# Patient Record
Sex: Female | Born: 1970 | Hispanic: Yes | Marital: Married | Smoking: Former smoker
Health system: Southern US, Community
[De-identification: ages and names within clinical notes are randomized; demographics above are authoritative.]

## PROBLEM LIST (undated history)

## (undated) DIAGNOSIS — N6320 Unspecified lump in the left breast, unspecified quadrant: Secondary | ICD-10-CM

## (undated) DIAGNOSIS — Q513 Bicornate uterus: Secondary | ICD-10-CM

## (undated) DIAGNOSIS — L709 Acne, unspecified: Secondary | ICD-10-CM

## (undated) DIAGNOSIS — K5909 Other constipation: Secondary | ICD-10-CM

## (undated) DIAGNOSIS — E039 Hypothyroidism, unspecified: Secondary | ICD-10-CM

## (undated) HISTORY — DX: Unspecified lump in the left breast, unspecified quadrant: N63.20

## (undated) HISTORY — PX: WISDOM TOOTH EXTRACTION: SHX21

## (undated) HISTORY — DX: Other constipation: K59.09

## (undated) HISTORY — DX: Acne, unspecified: L70.9

## (undated) HISTORY — DX: Hypothyroidism, unspecified: E03.9

## (undated) HISTORY — DX: Bicornate uterus: Q51.3

---

## 2000-01-02 HISTORY — PX: BREAST REDUCTION SURGERY: SHX8

## 2003-01-27 ENCOUNTER — Other Ambulatory Visit: Admission: RE | Admit: 2003-01-27 | Discharge: 2003-01-27 | Payer: Self-pay | Admitting: Family Medicine

## 2003-02-17 ENCOUNTER — Other Ambulatory Visit: Admission: RE | Admit: 2003-02-17 | Discharge: 2003-02-17 | Payer: Self-pay | Admitting: Family Medicine

## 2007-05-23 ENCOUNTER — Ambulatory Visit (HOSPITAL_COMMUNITY): Admission: RE | Admit: 2007-05-23 | Discharge: 2007-05-23 | Payer: Self-pay | Admitting: Family Medicine

## 2007-06-02 DIAGNOSIS — N6320 Unspecified lump in the left breast, unspecified quadrant: Secondary | ICD-10-CM

## 2007-06-02 HISTORY — DX: Unspecified lump in the left breast, unspecified quadrant: N63.20

## 2010-12-14 ENCOUNTER — Encounter: Payer: Self-pay | Admitting: Physician Assistant

## 2011-01-10 ENCOUNTER — Encounter: Payer: Self-pay | Admitting: Physician Assistant

## 2011-04-19 ENCOUNTER — Encounter: Payer: Self-pay | Admitting: Physician Assistant

## 2011-05-31 ENCOUNTER — Encounter: Payer: Self-pay | Admitting: Physician Assistant

## 2011-06-28 ENCOUNTER — Ambulatory Visit (INDEPENDENT_AMBULATORY_CARE_PROVIDER_SITE_OTHER): Payer: BC Managed Care – PPO | Admitting: Physician Assistant

## 2011-06-28 ENCOUNTER — Encounter: Payer: Self-pay | Admitting: Physician Assistant

## 2011-06-28 VITALS — BP 100/57 | HR 74 | Temp 97.8°F | Resp 16 | Ht 62.5 in | Wt 124.6 lb

## 2011-06-28 DIAGNOSIS — Z0000 Encounter for general adult medical examination without abnormal findings: Secondary | ICD-10-CM

## 2011-06-28 DIAGNOSIS — L708 Other acne: Secondary | ICD-10-CM

## 2011-06-28 DIAGNOSIS — L709 Acne, unspecified: Secondary | ICD-10-CM

## 2011-06-28 LAB — POCT URINALYSIS DIPSTICK
Bilirubin, UA: NEGATIVE
Glucose, UA: NEGATIVE
Nitrite, UA: NEGATIVE
Spec Grav, UA: 1.015
pH, UA: 7

## 2011-06-28 LAB — LIPID PANEL
Cholesterol: 173 mg/dL (ref 0–200)
HDL: 47 mg/dL (ref >39)
LDL Cholesterol: 111 mg/dL — ABNORMAL HIGH (ref 0–99)
Total CHOL/HDL Ratio: 3.7 Ratio
Triglycerides: 75 mg/dL (ref <150)
VLDL: 15 mg/dL (ref 0–40)

## 2011-06-28 LAB — POCT UA - MICROSCOPIC ONLY
Casts, Ur, LPF, POC: NEGATIVE
Mucus, UA: NEGATIVE
Yeast, UA: NEGATIVE

## 2011-06-28 LAB — COMPREHENSIVE METABOLIC PANEL
Alkaline Phosphatase: 73 U/L (ref 39–117)
BUN: 10 mg/dL (ref 6–23)
CO2: 25 mEq/L (ref 19–32)
Creat: 0.65 mg/dL (ref 0.50–1.10)
Glucose, Bld: 87 mg/dL (ref 70–99)
Total Bilirubin: 0.5 mg/dL (ref 0.3–1.2)
Total Protein: 6.8 g/dL (ref 6.0–8.3)

## 2011-06-28 LAB — CBC WITH DIFFERENTIAL/PLATELET
Basophils Relative: 0 % (ref 0–1)
Eosinophils Absolute: 0.2 10*3/uL (ref 0.0–0.7)
Eosinophils Relative: 3 % (ref 0–5)
Hemoglobin: 11.9 g/dL — ABNORMAL LOW (ref 12.0–15.0)
Lymphs Abs: 2 10*3/uL (ref 0.7–4.0)
MCH: 29 pg (ref 26.0–34.0)
MCHC: 33.4 g/dL (ref 30.0–36.0)
MCV: 86.6 fL (ref 78.0–100.0)
Monocytes Absolute: 0.4 10*3/uL (ref 0.1–1.0)
Monocytes Relative: 8 % (ref 3–12)
RBC: 4.11 MIL/uL (ref 3.87–5.11)

## 2011-06-28 NOTE — Patient Instructions (Addendum)
Keeping You Healthy  Get These Tests 1. Blood Pressure- Have your blood pressure checked once a year by your health care provider.  Normal blood pressure is 120/80. 2. Weight- Have your body mass index (BMI) calculated to screen for obesity.  BMI is measure of body fat based on height and weight.  You can also calculate your own BMI at https://www.west-esparza.com/. 3. Cholesterol- Have your cholesterol checked every 5 years starting at age 40 then yearly starting at age 79. 4. Chlamydia, HIV, and other sexually transmitted diseases- Get screened every year until age 16, then within three months of each new sexual provider. 5. Pap Smear- Every 1-3 years; discuss with your health care provider. 6. Mammogram- Every year starting at age 72  Take these medicines  Calcium with Vitamin D-Your body needs 1200 mg of Calcium each day and [phone] IU of Vitamin D daily.  Your body can only absorb 500 mg of Calcium at a time so Calcium must be taken in 2 or 3 divided doses throughout the day.  Multivitamin with folic acid- Once daily if it is possible for you to become pregnant.  Get these Immunizations  Gardasil-Series of three doses; prevents HPV related illness such as genital warts and cervical cancer.  Menactra-Single dose; prevents meningitis.  Tetanus shot- Every 10 years.  Flu shot-Every year.  Take these steps 1. Do not smoke-Your healthcare provider can help you quit.  For tips on how to quit go to www.smokefree.gov or call 1-800 QUITNOW. 2. Be physically active- Exercise 5 days a week for at least 30 minutes.  If you are not already physically active, start slow and gradually work up to 30 minutes of moderate physical activity.  Examples of moderate activity include walking briskly, dancing, swimming, bicycling, etc. 3. Breast Cancer- A self breast exam every month is important for early detection of breast cancer.  For more information and instruction on self breast exams, ask your  healthcare provider or [email or web address].[employer]. 4. Eat a healthy diet- Eat a variety of healthy foods such as fruits, vegetables, whole grains, low fat milk, low fat cheeses, yogurt, lean meats, poultry and fish, beans, nuts, tofu, etc.  For more information go to www. Thenutritionsource.org 5. Drink alcohol in moderation- Limit alcohol intake to one drink or less per day. Never drink and drive. 6. Depression- Your emotional health is as important as your physical health.  If you're feeling down or losing interest in things you normally enjoy please talk to your healthcare provider about being screened for depression. 7. Dental visit- Brush and floss your teeth twice daily; visit your dentist twice a year. 8. Eye doctor- Get an eye exam at least every 2 years. 9. Helmet use- Always wear a helmet when riding a bicycle, motorcycle, rollerblading or skateboarding. 10. Safe sex- If you may be exposed to sexually transmitted infections, use a condom. 11. Seat belts- Seat belts can save your live; always wear one. 12. Smoke/Carbon Monoxide detectors- These detectors need to be installed on the appropriate level of your home. Replace batteries at least once a year. 13. Skin cancer- When out in the sun please cover up and use sunscreen 15 SPF or higher. 14. Violence- If anyone is threatening or hurting you, please tell your healthcare provider.  To help with Dry Skin Drink at least 64 ounces of water daily. Consider a humidifier for the room where you sleep. Bathe once daily. Avoid using HOT water, as it dries skin. Avoid deodorant soaps (Dial is the  worst!) and stick with gentle cleansers (I like Cetaphil Liquid Cleanser). After bathing, dry off completely, then apply a thick emollient cream (I like Cetaphil Moisturizing Cream). Apply the cream twice daily, or more!  Try Jasmine December for lips [employer] sells it, as well as Air cabin crew at [employer])  [employer] at [employer].  They may have names of people to board or care for the lizard.

## 2011-06-28 NOTE — Progress Notes (Signed)
Subjective:    Patient ID: Melody Butler, female    DOB: Jul 04, 1970, 41 y.o.   MRN: [id number]  HPI  Presents for CPE, breast and pap test. History of left breast fibroadenoma. Mammogram in 12/08/2010, benign.  Review of Systems  Constitutional: Negative.   HENT: Negative.   Eyes: Negative.   Respiratory: Negative.   Cardiovascular: Negative.   Gastrointestinal: Negative.   Genitourinary: Negative.        [id number] Last pap 10/2009 was normal, HPV negative  Musculoskeletal: Positive for myalgias, back pain and arthralgias.       Joint and muscle pain since starting Accutane 3 months ago  Skin: Negative for color change, pallor, rash and wound.       Extremely dry skin since starting Accutane 3 months ago.  Neurological: Negative.   Hematological: Negative.   Psychiatric/Behavioral: Negative.        Objective:   Physical Exam  Vitals reviewed. Constitutional: She is oriented to person, place, and time. Vital signs are normal. She appears well-developed and well-nourished. No distress.  HENT:  Head: Normocephalic and atraumatic.  Right Ear: Hearing, tympanic membrane, external ear and ear canal normal. No foreign bodies.  Left Ear: Hearing, tympanic membrane, external ear and ear canal normal. No foreign bodies.  Nose: Nose normal.  Mouth/Throat: Uvula is midline, oropharynx is clear and moist and mucous membranes are normal. No oral lesions. Normal dentition. No dental abscesses or uvula swelling. No oropharyngeal exudate.  Eyes: Conjunctivae and EOM are normal. Pupils are equal, round, and reactive to light. Right eye exhibits no discharge. Left eye exhibits no discharge. No scleral icterus.  Fundoscopic exam:      The right eye shows no arteriolar narrowing, no AV nicking, no exudate, no hemorrhage and no papilledema. The right eye shows red reflex.The right eye shows no venous pulsations.      The left eye shows no arteriolar narrowing, no AV nicking, no exudate, no  hemorrhage and no papilledema. The left eye shows red reflex.The left eye shows no venous pulsations. Neck: Trachea normal, normal range of motion and full passive range of motion without pain. Neck supple. No spinous process tenderness and no muscular tenderness present. No mass and no thyromegaly present.  Cardiovascular: Normal rate, regular rhythm, normal heart sounds, intact distal pulses and normal pulses.   Pulmonary/Chest: Effort normal and breath sounds normal. She exhibits no tenderness and no retraction. Right breast exhibits no inverted nipple, no mass, no nipple discharge, no skin change and no tenderness. Left breast exhibits no inverted nipple, no mass, no nipple discharge, no skin change and no tenderness. Breasts are symmetrical.  Abdominal: Soft. Normal appearance and bowel sounds are normal. She exhibits no distension and no mass. There is no hepatosplenomegaly. There is no tenderness. There is no rigidity, no rebound, no guarding, no CVA tenderness, no tenderness at McBurney's point and negative Murphy's sign. No hernia.  Genitourinary: Rectum normal. No breast swelling, tenderness, discharge or bleeding.       Breasts are very dense in the upper outer region bilaterally. Scars from breast reduction surgery are noted.  Musculoskeletal: She exhibits no edema and no tenderness.       Cervical back: Normal.       Thoracic back: Normal.       Lumbar back: Normal.  Lymphadenopathy:       Head (right side): No tonsillar, no preauricular, no posterior auricular and no occipital adenopathy present.       Head (left side):  No tonsillar, no preauricular, no posterior auricular and no occipital adenopathy present.    She has no cervical adenopathy.    She has no axillary adenopathy.       Right: No supraclavicular adenopathy present.       Left: No supraclavicular adenopathy present.  Neurological: She is alert and oriented to person, place, and time. She has normal strength and normal  reflexes. No cranial nerve deficit. She exhibits normal muscle tone. Coordination and gait normal.  Skin: Skin is warm, dry and intact. No rash noted. She is not diaphoretic. No cyanosis or erythema. Nails show no clubbing.       Skin is extremely dry, especially the lips  Psychiatric: She has a normal mood and affect. Her speech is normal and behavior is normal. Judgment and thought content normal.      Assessment & Plan:   1. Routine general medical examination at a health care facility  POCT UA - Microscopic Only, POCT urinalysis dipstick, CBC with Differential, Comprehensive metabolic panel, Lipid panel, TSH; Repeat pap and HPV testing in 1-3 years. Repeat mammogram in 12/2011.  2. Acne  Continue follow-up with prescriber.  Re-inforced adequate hydration, good skin hygiene and suggested emollient lip product.

## 2011-06-30 ENCOUNTER — Encounter: Payer: Self-pay | Admitting: Physician Assistant

## 2011-08-03 ENCOUNTER — Ambulatory Visit: Payer: Self-pay | Admitting: Internal Medicine

## 2011-08-08 ENCOUNTER — Encounter (HOSPITAL_COMMUNITY): Payer: Self-pay | Admitting: Pharmacy Technician

## 2011-08-09 ENCOUNTER — Encounter (HOSPITAL_COMMUNITY): Payer: Self-pay

## 2011-08-09 ENCOUNTER — Encounter (HOSPITAL_COMMUNITY)
Admission: RE | Admit: 2011-08-09 | Discharge: 2011-08-09 | Disposition: A | Discharge status: Home / Self Care | Payer: BC Managed Care – PPO | Source: Ambulatory Visit | Attending: Specialist | Admitting: Specialist

## 2011-08-09 LAB — BASIC METABOLIC PANEL
BUN: 12 mg/dL (ref 6–23)
CO2: 25 mEq/L (ref 19–32)
Chloride: 104 mEq/L (ref 96–112)
GFR calc Af Amer: >90 mL/min (ref >90)
Potassium: 4.6 mEq/L (ref 3.5–5.1)

## 2011-08-09 LAB — CBC
HCT: 33.5 % — ABNORMAL LOW (ref 36.0–46.0)
Hemoglobin: 11.2 g/dL — ABNORMAL LOW (ref 12.0–15.0)
MCHC: 33.4 g/dL (ref 30.0–36.0)
MCV: 88.4 fL (ref 78.0–100.0)
WBC: 7.7 10*3/uL (ref 4.0–10.5)

## 2011-08-09 LAB — SURGICAL PCR SCREEN: Staphylococcus aureus: POSITIVE — AB

## 2011-08-09 MED ORDER — CHLORHEXIDINE GLUCONATE 4 % EX LIQD: 60.0000 mL | Freq: Once | CUTANEOUS | Status: DC

## 2011-08-09 MED ORDER — CEFAZOLIN SODIUM-DEXTROSE 2-3 GM-% IV SOLR
2.0000 g | INTRAVENOUS | Status: AC
  Administered 2011-08-10: 2 g via INTRAVENOUS
  Filled 2011-08-09: qty 50

## 2011-08-09 NOTE — Pre-Procedure Instructions (Signed)
20 Treva Huyett  08/09/2011   Your procedure is scheduled on:  Friday August 10, 2011 at 1230 PM  Report to [facility] at 1030 AM.  Call this number if you have problems the morning of surgery: [phone]   Remember:   Do not eat food or drink:After Midnight.      Take these medicines the morning of surgery with A SIP OF WATER: Hydrocodone [ NORCO/VICODIN]   Do not wear jewelry, make-up or nail polish.  Do not wear lotions, powders, or perfumes. You may wear deodorant.  Do not shave 48 hours prior to surgery.  Do not bring valuables to the hospital.  Contacts, dentures or bridgework may not be worn into surgery.  Leave suitcase in the car. After surgery it may be brought to your room.  For patients admitted to the hospital, checkout time is 11:00 AM the day of discharge.   Patients discharged the day of surgery will not be allowed to drive home.    Special Instructions: Incentive Spirometry - Practice and bring it with you on the day of surgery. and CHG Shower Use Special Wash: 1/2 bottle night before surgery and 1/2 bottle morning of surgery.   Please read over the following fact sheets that you were given: Pain Booklet, Coughing and Deep Breathing, MRSA Information and Surgical Site Infection Prevention

## 2011-08-10 ENCOUNTER — Encounter (HOSPITAL_COMMUNITY): Payer: Self-pay | Admitting: Specialist

## 2011-08-10 ENCOUNTER — Ambulatory Visit (HOSPITAL_COMMUNITY): Payer: BC Managed Care – PPO | Admitting: Anesthesiology

## 2011-08-10 ENCOUNTER — Observation Stay (HOSPITAL_COMMUNITY)
Admission: RE | Admit: 2011-08-10 | Discharge: 2011-08-11 | DRG: 219 | Disposition: A | Discharge status: Home / Self Care | Payer: BC Managed Care – PPO | Source: Ambulatory Visit | Attending: Specialist | Admitting: Specialist

## 2011-08-10 ENCOUNTER — Encounter (HOSPITAL_COMMUNITY)
Admission: RE | Disposition: A | Discharge status: Home / Self Care | Payer: Self-pay | Source: Ambulatory Visit | Attending: Specialist

## 2011-08-10 ENCOUNTER — Encounter (HOSPITAL_COMMUNITY): Payer: Self-pay | Admitting: Anesthesiology

## 2011-08-10 ENCOUNTER — Encounter (HOSPITAL_COMMUNITY): Payer: Self-pay | Admitting: *Deleted

## 2011-08-10 ENCOUNTER — Inpatient Hospital Stay (HOSPITAL_COMMUNITY): Payer: BC Managed Care – PPO

## 2011-08-10 DIAGNOSIS — X500XXA Overexertion from strenuous movement or load, initial encounter: Secondary | ICD-10-CM | POA: Insufficient documentation

## 2011-08-10 DIAGNOSIS — Z01812 Encounter for preprocedural laboratory examination: Secondary | ICD-10-CM | POA: Insufficient documentation

## 2011-08-10 DIAGNOSIS — S82843A Displaced bimalleolar fracture of unspecified lower leg, initial encounter for closed fracture: Principal | ICD-10-CM | POA: Insufficient documentation

## 2011-08-10 DIAGNOSIS — S82841A Displaced bimalleolar fracture of right lower leg, initial encounter for closed fracture: Secondary | ICD-10-CM | POA: Diagnosis present

## 2011-08-10 HISTORY — PX: ORIF ANKLE FRACTURE: SHX5408

## 2011-08-10 SURGERY — OPEN REDUCTION INTERNAL FIXATION (ORIF) ANKLE FRACTURE
Anesthesia: General | Site: Ankle | Laterality: Left | Wound class: Clean

## 2011-08-10 MED ORDER — MAGNESIUM CITRATE PO SOLN
1.0000 | Freq: Once | ORAL | Status: AC | PRN
  Filled 2011-08-10: qty 296

## 2011-08-10 MED ORDER — ENOXAPARIN SODIUM 30 MG/0.3ML ~~LOC~~ SOLN
30.0000 mg | Freq: Two times a day (BID) | SUBCUTANEOUS | Status: DC
  Administered 2011-08-11: 30 mg via SUBCUTANEOUS
  Filled 2011-08-10 (×4): qty 0.3

## 2011-08-10 MED ORDER — ISOTRETINOIN 40 MG PO CAPS: 40.0000 mg | ORAL_CAPSULE | Freq: Every day | ORAL | Status: DC

## 2011-08-10 MED ORDER — MIDAZOLAM HCL 2 MG/2ML IJ SOLN
1.0000 mg | INTRAMUSCULAR | Status: DC | PRN
  Administered 2011-08-10: 2 mg via INTRAVENOUS

## 2011-08-10 MED ORDER — METHOCARBAMOL 500 MG PO TABS
500.0000 mg | ORAL_TABLET | Freq: Four times a day (QID) | ORAL | Status: DC | PRN
  Administered 2011-08-10 – 2011-08-11 (×5): 500 mg via ORAL
  Filled 2011-08-10 (×4): qty 1

## 2011-08-10 MED ORDER — POLYETHYLENE GLYCOL 3350 17 G PO PACK: 17.0000 g | PACK | Freq: Every day | ORAL | Status: DC | PRN

## 2011-08-10 MED ORDER — METOCLOPRAMIDE HCL 5 MG/ML IJ SOLN: 5.0000 mg | Freq: Three times a day (TID) | INTRAMUSCULAR | Status: DC | PRN

## 2011-08-10 MED ORDER — MIDAZOLAM HCL 2 MG/2ML IJ SOLN
INTRAMUSCULAR | Status: AC
  Filled 2011-08-10: qty 2

## 2011-08-10 MED ORDER — MORPHINE SULFATE 2 MG/ML IJ SOLN
INTRAMUSCULAR | Status: AC
  Filled 2011-08-10: qty 1

## 2011-08-10 MED ORDER — MIDAZOLAM HCL 2 MG/2ML IJ SOLN: 0.5000 mg | Freq: Once | INTRAMUSCULAR | Status: DC | PRN

## 2011-08-10 MED ORDER — OXYCODONE HCL 5 MG PO TABS: 5.0000 mg | ORAL_TABLET | ORAL | Status: AC | PRN

## 2011-08-10 MED ORDER — ONDANSETRON HCL 4 MG/2ML IJ SOLN: 4.0000 mg | Freq: Four times a day (QID) | INTRAMUSCULAR | Status: DC | PRN

## 2011-08-10 MED ORDER — METHOCARBAMOL 500 MG PO TABS
ORAL_TABLET | ORAL | Status: AC
  Filled 2011-08-10: qty 1

## 2011-08-10 MED ORDER — DIPHENHYDRAMINE HCL 12.5 MG/5ML PO ELIX: 12.5000 mg | ORAL_SOLUTION | ORAL | Status: DC | PRN

## 2011-08-10 MED ORDER — ONDANSETRON HCL 4 MG/2ML IJ SOLN
INTRAMUSCULAR | Status: DC | PRN
  Administered 2011-08-10: 4 mg via INTRAVENOUS

## 2011-08-10 MED ORDER — OXYCODONE HCL 5 MG PO TABS
5.0000 mg | ORAL_TABLET | ORAL | Status: DC | PRN
  Administered 2011-08-10 (×2): 10 mg via ORAL
  Filled 2011-08-10: qty 2

## 2011-08-10 MED ORDER — OXYCODONE HCL 5 MG PO TABS
ORAL_TABLET | ORAL | Status: AC
  Filled 2011-08-10: qty 2

## 2011-08-10 MED ORDER — MEPERIDINE HCL 25 MG/ML IJ SOLN: 6.2500 mg | INTRAMUSCULAR | Status: DC | PRN

## 2011-08-10 MED ORDER — MUPIROCIN 2 % EX OINT
TOPICAL_OINTMENT | CUTANEOUS | Status: AC
  Filled 2011-08-10: qty 22

## 2011-08-10 MED ORDER — LACTATED RINGERS IV SOLN
INTRAVENOUS | Status: DC
  Administered 2011-08-10: 11:00:00 via INTRAVENOUS

## 2011-08-10 MED ORDER — 0.9 % SODIUM CHLORIDE (POUR BTL) OPTIME
TOPICAL | Status: DC | PRN
  Administered 2011-08-10: 1000 mL

## 2011-08-10 MED ORDER — FENTANYL CITRATE 0.05 MG/ML IJ SOLN
50.0000 ug | INTRAMUSCULAR | Status: DC | PRN
  Administered 2011-08-10: 100 ug via INTRAVENOUS

## 2011-08-10 MED ORDER — PROMETHAZINE HCL 25 MG/ML IJ SOLN: 6.2500 mg | INTRAMUSCULAR | Status: DC | PRN

## 2011-08-10 MED ORDER — BUPIVACAINE-EPINEPHRINE PF 0.5-1:200000 % IJ SOLN
INTRAMUSCULAR | Status: DC | PRN
  Administered 2011-08-10: 10 mL
  Administered 2011-08-10: 30 mL

## 2011-08-10 MED ORDER — DOCUSATE SODIUM 100 MG PO CAPS
100.0000 mg | ORAL_CAPSULE | Freq: Two times a day (BID) | ORAL | Status: DC
  Administered 2011-08-10 – 2011-08-11 (×2): 100 mg via ORAL
  Filled 2011-08-10 (×2): qty 1

## 2011-08-10 MED ORDER — CEFAZOLIN SODIUM 1-5 GM-% IV SOLN
1.0000 g | Freq: Four times a day (QID) | INTRAVENOUS | Status: AC
  Administered 2011-08-10 – 2011-08-11 (×3): 1 g via INTRAVENOUS
  Filled 2011-08-10 (×3): qty 50

## 2011-08-10 MED ORDER — MUPIROCIN 2 % EX OINT
TOPICAL_OINTMENT | Freq: Two times a day (BID) | CUTANEOUS | Status: DC
  Administered 2011-08-10: 22:00:00 via NASAL
  Administered 2011-08-10: 1 via NASAL
  Administered 2011-08-11: 10:00:00 via NASAL
  Filled 2011-08-10: qty 22

## 2011-08-10 MED ORDER — LACTATED RINGERS IV SOLN
INTRAVENOUS | Status: DC | PRN
  Administered 2011-08-10 (×2): via INTRAVENOUS

## 2011-08-10 MED ORDER — METOCLOPRAMIDE HCL 10 MG PO TABS: 5.0000 mg | ORAL_TABLET | Freq: Three times a day (TID) | ORAL | Status: DC | PRN

## 2011-08-10 MED ORDER — HYDROCODONE-ACETAMINOPHEN 5-325 MG PO TABS
1.0000 | ORAL_TABLET | ORAL | Status: DC | PRN
  Administered 2011-08-10 – 2011-08-11 (×5): 2 via ORAL
  Filled 2011-08-10 (×6): qty 2

## 2011-08-10 MED ORDER — METHOCARBAMOL 100 MG/ML IJ SOLN
500.0000 mg | Freq: Four times a day (QID) | INTRAMUSCULAR | Status: DC | PRN
  Filled 2011-08-10: qty 5

## 2011-08-10 MED ORDER — HYDROMORPHONE HCL PF 1 MG/ML IJ SOLN
INTRAMUSCULAR | Status: AC
  Filled 2011-08-10: qty 1

## 2011-08-10 MED ORDER — PROPOFOL 10 MG/ML IV EMUL
INTRAVENOUS | Status: DC | PRN
  Administered 2011-08-10: 200 mg via INTRAVENOUS

## 2011-08-10 MED ORDER — FENTANYL CITRATE 0.05 MG/ML IJ SOLN
INTRAMUSCULAR | Status: DC | PRN
  Administered 2011-08-10: 100 ug via INTRAVENOUS
  Administered 2011-08-10 (×2): 25 ug via INTRAVENOUS
  Administered 2011-08-10: 100 ug via INTRAVENOUS

## 2011-08-10 MED ORDER — HYDROMORPHONE HCL PF 1 MG/ML IJ SOLN
0.2500 mg | INTRAMUSCULAR | Status: DC | PRN
  Administered 2011-08-10 (×4): 0.5 mg via INTRAVENOUS

## 2011-08-10 MED ORDER — ONDANSETRON HCL 4 MG PO TABS: 4.0000 mg | ORAL_TABLET | Freq: Four times a day (QID) | ORAL | Status: DC | PRN

## 2011-08-10 MED ORDER — LIDOCAINE HCL (CARDIAC) 20 MG/ML IV SOLN
INTRAVENOUS | Status: DC | PRN
  Administered 2011-08-10: 50 mg via INTRAVENOUS

## 2011-08-10 MED ORDER — BISACODYL 5 MG PO TBEC
5.0000 mg | DELAYED_RELEASE_TABLET | Freq: Every day | ORAL | Status: DC | PRN
Start: 2011-08-10 — End: 2011-08-11

## 2011-08-10 MED ORDER — FENTANYL CITRATE 0.05 MG/ML IJ SOLN
INTRAMUSCULAR | Status: AC
  Filled 2011-08-10: qty 2

## 2011-08-10 MED ORDER — ASPIRIN 325 MG PO TABS
325.0000 mg | ORAL_TABLET | Freq: Two times a day (BID) | ORAL | Status: DC
  Administered 2011-08-10 – 2011-08-11 (×2): 325 mg via ORAL
  Filled 2011-08-10 (×4): qty 1

## 2011-08-10 MED ORDER — MORPHINE SULFATE 2 MG/ML IJ SOLN
1.0000 mg | INTRAMUSCULAR | Status: DC | PRN
  Administered 2011-08-10 – 2011-08-11 (×3): 1 mg via INTRAVENOUS
  Filled 2011-08-10: qty 1

## 2011-08-10 SURGICAL SUPPLY — 60 items
BANDAGE ELASTIC 6 VELCRO ST LF (GAUZE/BANDAGES/DRESSINGS) IMPLANT
BANDAGE ESMARK 6X9 LF (GAUZE/BANDAGES/DRESSINGS) ×1 IMPLANT
BANDAGE GAUZE ELAST BULKY 4 IN (GAUZE/BANDAGES/DRESSINGS) IMPLANT
BIT DRILL 2.5X110 QC LCP DISP (BIT) ×1 IMPLANT
BIT DRILL QC 3.5X110 (BIT) ×1 IMPLANT
BLADE SURG 10 STRL SS (BLADE) ×2 IMPLANT
BNDG CMPR 9X6 STRL LF SNTH (GAUZE/BANDAGES/DRESSINGS) ×1
BNDG ESMARK 6X9 LF (GAUZE/BANDAGES/DRESSINGS) ×2
CLOTH BEACON ORANGE TIMEOUT ST (SAFETY) ×2 IMPLANT
COVER SURGICAL LIGHT HANDLE (MISCELLANEOUS) ×2 IMPLANT
CUFF TOURNIQUET SINGLE 34IN LL (TOURNIQUET CUFF) ×1 IMPLANT
CUFF TOURNIQUET SINGLE 44IN (TOURNIQUET CUFF) IMPLANT
DRAPE OEC MINIVIEW 54X84 (DRAPES) ×1 IMPLANT
DRAPE U-SHAPE 47X51 STRL (DRAPES) ×2 IMPLANT
DRSG ADAPTIC 3X8 NADH LF (GAUZE/BANDAGES/DRESSINGS) IMPLANT
DRSG PAD ABDOMINAL 8X10 ST (GAUZE/BANDAGES/DRESSINGS) IMPLANT
DURAPREP 26ML APPLICATOR (WOUND CARE) ×1 IMPLANT
ELECT REM PT RETURN 9FT ADLT (ELECTROSURGICAL) ×2
ELECTRODE REM PT RTRN 9FT ADLT (ELECTROSURGICAL) ×1 IMPLANT
GLOVE BIOGEL PI IND STRL 7.5 (GLOVE) ×1 IMPLANT
GLOVE BIOGEL PI INDICATOR 7.5 (GLOVE) ×1
GLOVE ECLIPSE 7.0 STRL STRAW (GLOVE) ×2 IMPLANT
GLOVE ECLIPSE 8.5 STRL (GLOVE) ×2 IMPLANT
GLOVE SURG 8.5 LATEX PF (GLOVE) ×2 IMPLANT
GOWN PREVENTION PLUS LG XLONG (DISPOSABLE) IMPLANT
GOWN PREVENTION PLUS XXLARGE (GOWN DISPOSABLE) ×2 IMPLANT
GOWN STRL NON-REIN LRG LVL3 (GOWN DISPOSABLE) ×4 IMPLANT
KIT BASIN OR (CUSTOM PROCEDURE TRAY) ×2 IMPLANT
KIT ROOM TURNOVER OR (KITS) ×2 IMPLANT
MANIFOLD NEPTUNE II (INSTRUMENTS) ×2 IMPLANT
NEEDLE 22X1 1/2 (OR ONLY) (NEEDLE) ×2 IMPLANT
NS IRRIG 1000ML POUR BTL (IV SOLUTION) ×2 IMPLANT
PACK ORTHO EXTREMITY (CUSTOM PROCEDURE TRAY) ×2 IMPLANT
PAD ARMBOARD 7.5X6 YLW CONV (MISCELLANEOUS) ×4 IMPLANT
PAD CAST 4YDX4 CTTN HI CHSV (CAST SUPPLIES) IMPLANT
PADDING CAST COTTON 4X4 STRL (CAST SUPPLIES)
PLATE LCP 3.5 1/3 TUB 6HX69 (Plate) ×1 IMPLANT
SCREW CANC FT 12 4.0 (Screw) ×1 IMPLANT
SCREW CANC FT ST SFS 4X16 (Screw) ×1 IMPLANT
SCREW CORTEX 3.5 12MM (Screw) ×3 IMPLANT
SCREW CORTEX 3.5 18MM (Screw) ×1 IMPLANT
SCREW CORTEX 3.5 20MM (Screw) IMPLANT
SCREW CORTEX 3.5 26MM (Screw) ×1 IMPLANT
SCREW LOCK CORT ST 3.5X12 (Screw) IMPLANT
SCREW LOCK CORT ST 3.5X18 (Screw) IMPLANT
SCREW LOCK CORT ST 3.5X20 (Screw) IMPLANT
SCREW LOCK CORT ST 3.5X26 (Screw) IMPLANT
SPONGE GAUZE 4X4 12PLY (GAUZE/BANDAGES/DRESSINGS) IMPLANT
SPONGE LAP 4X18 X RAY DECT (DISPOSABLE) ×4 IMPLANT
SUCTION FRAZIER TIP 10 FR DISP (SUCTIONS) ×2 IMPLANT
SUT ETHILON 4 0 PS 2 18 (SUTURE) ×4 IMPLANT
SUT VIC AB 0 CT1 27 (SUTURE) ×4
SUT VIC AB 0 CT1 27XBRD ANBCTR (SUTURE) ×2 IMPLANT
SUT VIC AB 2-0 CT1 27 (SUTURE) ×4
SUT VIC AB 2-0 CT1 TAPERPNT 27 (SUTURE) ×2 IMPLANT
SYR CONTROL 10ML LL (SYRINGE) ×2 IMPLANT
TOWEL OR 17X24 6PK STRL BLUE (TOWEL DISPOSABLE) ×2 IMPLANT
TOWEL OR 17X26 10 PK STRL BLUE (TOWEL DISPOSABLE) ×2 IMPLANT
TUBE CONNECTING 12X1/4 (SUCTIONS) ×2 IMPLANT
WATER STERILE IRR 1000ML POUR (IV SOLUTION) ×2 IMPLANT

## 2011-08-10 NOTE — H&P (Signed)
Melody Butler is an 41 y.o. female.   Chief Complaint:Left ankle pain.+ HPI:41 year old female visiting relatives in [place] when she twisted her left ankle while wlking on [place] heel. Xrays with left ankle fracture 7/27. Returned to [facility] this past weekend and presented to Urgent Care Clinic Monday 08/06/2011 referred to [facility] seen on Tues with the left ankle showing a subluxed Weber B fracture of the left ankle. Recommended ORIF.  Past Medical History  Diagnosis Date  . Bicornate uterus   . Breast mass, left 06/2007  . Acne   . Constipation, chronic     Past Surgical History  Procedure Date  . Cesarean section     x2  . Breast reduction surgery 2002    Family History  Problem Relation Age of Onset  . Hypertension Mother   . Cancer Maternal Grandmother     breast CA  . Cancer Paternal Grandfather     prostate CA   Social History:  reports that she quit smoking about 5 months ago. She does not have any smokeless tobacco history on file. She reports that she drinks alcohol. She reports that she does not use illicit drugs.  Allergies: No Known Allergies  Medications Prior to Admission  Medication Sig Dispense Refill  . HYDROcodone-acetaminophen (NORCO/VICODIN) 5-325 MG per tablet Take 1-2 tablets by mouth every 4 (four) hours as needed. For pain      . ISOtretinoin (ACCUTANE) 40 MG capsule Take 40 mg by mouth daily.      . meloxicam (MOBIC) 15 MG tablet Take 15 mg by mouth daily as needed. For pain        Results for orders placed during the hospital encounter of 08/09/11 (from the past 48 hour(s))  HCG, SERUM, QUALITATIVE     Status: Normal   Collection Time   08/09/11  3:35 PM      Component Value Range Comment   Preg, Serum NEGATIVE  NEGATIVE   BASIC METABOLIC PANEL     Status: Normal   Collection Time   08/09/11  3:35 PM      Component Value Range Comment   Sodium 136  135 - 145 mEq/L    Potassium 4.6  3.5 - 5.1 mEq/L    Chloride 104  96 - 112 mEq/L    CO2 25  19 - 32 mEq/L    Glucose, Bld 96  70 - 99 mg/dL    BUN 12  6 - 23 mg/dL    Creatinine, Ser 1.61  0.50 - 1.10 mg/dL    Calcium 9.0  8.4 - 09.6 mg/dL    GFR calc non Af Amer >90  >90 mL/min    GFR calc Af Amer >90  >90 mL/min   CBC     Status: Abnormal   Collection Time   08/09/11  3:35 PM      Component Value Range Comment   WBC 7.7  4.0 - 10.5 K/uL    RBC 3.79 (*) 3.87 - 5.11 MIL/uL    Hemoglobin 11.2 (*) 12.0 - 15.0 g/dL    HCT 04.5 (*) 40.9 - 46.0 %    MCV 88.4  78.0 - 100.0 fL    MCH 29.6  26.0 - 34.0 pg    MCHC 33.4  30.0 - 36.0 g/dL    RDW 81.1  91.4 - 78.2 %    Platelets 308  150 - 400 K/uL   SURGICAL PCR SCREEN     Status: Abnormal   Collection  Time   08/09/11  3:39 PM      Component Value Range Comment   MRSA, PCR NEGATIVE  NEGATIVE    Staphylococcus aureus POSITIVE (*) NEGATIVE    No results found.  Review of Systems  Constitutional: Negative for fever, chills, weight loss, malaise/fatigue and diaphoresis.  HENT: Negative for hearing loss, ear pain, nosebleeds, congestion, sore throat, neck pain, tinnitus and ear discharge.   Eyes: Negative for blurred vision, double vision, photophobia, pain, discharge and redness.  Respiratory: Negative for cough, hemoptysis, sputum production, shortness of breath, wheezing and stridor.   Cardiovascular: Negative for chest pain, palpitations, orthopnea, claudication, leg swelling and PND.  Gastrointestinal: Negative for heartburn, nausea, vomiting, abdominal pain, diarrhea, constipation, blood in stool and melena.  Genitourinary: Negative for dysuria, urgency, frequency, hematuria and flank pain.  Musculoskeletal: Positive for back pain, joint pain and falls. Negative for myalgias.  Skin: Negative for itching and rash.  Neurological: Negative for dizziness, tingling, tremors, sensory change, speech change, focal weakness, seizures, loss of consciousness, weakness and headaches.  Endo/Heme/Allergies: Negative for environmental  allergies and polydipsia. Does not bruise/bleed easily.  Psychiatric/Behavioral: Negative for depression, suicidal ideas, hallucinations, memory loss and substance abuse. The patient is not nervous/anxious and does not have insomnia.     Last menstrual period 07/18/2011. Physical Exam  Constitutional: She is oriented to person, place, and time. She appears well-developed and well-nourished. No distress.  HENT:  Head: Normocephalic and atraumatic.  Right Ear: External ear normal.  Left Ear: External ear normal.  Nose: Nose normal.  Mouth/Throat: Oropharynx is clear and moist. No oropharyngeal exudate.  Eyes: Conjunctivae and EOM are normal. Pupils are equal, round, and reactive to light. Right eye exhibits no discharge. Left eye exhibits no discharge. No scleral icterus.  Neck: Normal range of motion. Neck supple. No JVD present. No tracheal deviation present. No thyromegaly present.  Cardiovascular: Normal rate, regular rhythm, normal heart sounds and intact distal pulses.  Exam reveals no gallop and no friction rub.   No murmur heard. Respiratory: Effort normal and breath sounds normal. No stridor. No respiratory distress. She has no wheezes. She has no rales. She exhibits no tenderness.  GI: Soft. Bowel sounds are normal. She exhibits no distension and no mass. There is no tenderness. There is no rebound and no guarding.  Musculoskeletal: Normal range of motion. She exhibits no edema and no tenderness.  Lymphadenopathy:    She has no cervical adenopathy.  Neurological: She is alert and oriented to person, place, and time. She has normal reflexes. She displays normal reflexes. No cranial nerve deficit. She exhibits abnormal muscle tone. Coordination normal.  Skin: Skin is warm and dry. No rash noted. She is not diaphoretic. No erythema. No pallor.  Psychiatric: She has a normal mood and affect. Her behavior is normal. Judgment and thought content normal.  Left leg with short leg slint, mild  sweling medial and lateral malleoli pulses are normal and sensory and motor are normal. Xray with lateral malleolus oblique spiral fracture with mild displacement, mortise is assymetric with lateral shift of talar dome the medial clear space measures 7 mm The superior joint line 3 mm.  Assessment/Plan Weber B left ankle fracture with lateral shif(subluxaation of the left ankle.  OR for ORIF of the left ankle  Plan ORIF of the lateral malleolus check for reduction of the mortise if the medial joint remains widened then exploration of the medial joint with deltoid repair.  NITKA,JAMES E 08/10/2011, 12:44 PM

## 2011-08-10 NOTE — Transfer of Care (Signed)
Immediate Anesthesia Transfer of Care Note  Patient: Melody Butler  Procedure(s) Performed: Procedure(s) (LRB): OPEN REDUCTION INTERNAL FIXATION (ORIF) ANKLE FRACTURE (Left)  Patient Location: PACU  Anesthesia Type: GA combined with regional for post-op pain  Level of Consciousness: awake and alert   Airway & Oxygen Therapy: Patient Spontanous Breathing and Patient connected to nasal cannula oxygen  Post-op Assessment: Report given to PACU RN and Post -op Vital signs reviewed and stable  Post vital signs: Reviewed and stable  Complications: No apparent anesthesia complications

## 2011-08-10 NOTE — H&P (Signed)
The patient has been re-examined, and the chart reviewed, and there have been no interval changes to the documented history and physical.    The risks, benefits, and alternatives have been discussed at length, and the patient is willing to proceed.   

## 2011-08-10 NOTE — Anesthesia Preprocedure Evaluation (Signed)
Anesthesia Evaluation  Patient identified by MRN, date of birth, ID band Patient awake    Reviewed: Allergy & Precautions, H&P , NPO status , Patient's Chart, lab work & pertinent test results  History of Anesthesia Complications Negative for: history of anesthetic complications  Airway Mallampati: I TM Distance: >3 FB Neck ROM: Full    Dental No notable dental hx. (+) Teeth Intact and Dental Advisory Given   Pulmonary former smoker (quit 2 years),  breath sounds clear to auscultation  Pulmonary exam normal       Cardiovascular negative cardio ROS  Rhythm:Regular Rate:Normal     Neuro/Psych negative neurological ROS     GI/Hepatic negative GI ROS, Neg liver ROS,   Endo/Other  negative endocrine ROS  Renal/GU negative Renal ROS     Musculoskeletal   Abdominal   Peds  Hematology   Anesthesia Other Findings   Reproductive/Obstetrics                           Anesthesia Physical Anesthesia Plan  ASA: II  Anesthesia Plan: General   Post-op Pain Management:    Induction: Intravenous  Airway Management Planned: LMA  Additional Equipment:   Intra-op Plan:   Post-operative Plan:   Informed Consent: I have reviewed the patients History and Physical, chart, labs and discussed the procedure including the risks, benefits and alternatives for the proposed anesthesia with the patient or authorized representative who has indicated his/her understanding and acceptance.   Dental advisory given  Plan Discussed with: CRNA and Surgeon  Anesthesia Plan Comments: (Plan routine monitors, GA- LMA OK, popliteal block for post op analgesia)        Anesthesia Quick Evaluation

## 2011-08-10 NOTE — Preoperative (Signed)
Beta Blockers   Reason not to administer Beta Blockers:Not Applicable 

## 2011-08-10 NOTE — Anesthesia Postprocedure Evaluation (Signed)
  Anesthesia Post-op Note  Patient: Melody Butler  Procedure(s) Performed: Procedure(s) (LRB): OPEN REDUCTION INTERNAL FIXATION (ORIF) ANKLE FRACTURE (Left)  Patient Location: PACU  Anesthesia Type: General  Level of Consciousness: awake, alert , oriented and patient cooperative  Airway and Oxygen Therapy: Patient Spontanous Breathing and Patient connected to nasal cannula oxygen  Post-op Pain: mild  Post-op Assessment: Post-op Vital signs reviewed, Patient's Cardiovascular Status Stable, Respiratory Function Stable, Patent Airway, No signs of Nausea or vomiting and Pain level controlled  Post-op Vital Signs: stable  Complications: No apparent anesthesia complications

## 2011-08-10 NOTE — Anesthesia Procedure Notes (Addendum)
Anesthesia Regional Block:  Popliteal block  Pre-Anesthetic Checklist: ,, timeout performed, Correct Patient, Correct Site, Correct Laterality, Correct Procedure, Correct Position, site marked, Risks and benefits discussed,  Surgical consent,  Pre-op evaluation,  At surgeon's request and post-op pain management  Laterality: Left  Prep: chloraprep       Needles:  Injection technique: Single-shot  Needle Type: Stimulator Needle - 80     Needle Length: 5cm 5 cm Needle Gauge: 22 and 22 G    Additional Needles:  Procedures: nerve stimulator Popliteal block  Nerve Stimulator or Paresthesia:  Response: toe abduction, 0.45 mA, 0.1 ms,   Additional Responses:   Narrative:  Start time: 08/10/2011 12:14 PM End time: 08/10/2011 12:21 PM Injection made incrementally with aspirations every 5 mL.  Performed by: Personally  Anesthesiologist: Sandford Craze, MD  Additional Notes: Pt identified in Holding room.  Monitors applied. Working IV access confirmed. Sterile prep L lateral knee.  #[id number] PNS to toe abduction twitch at 0.85mA threshold.  30cc 0.5% Bupivacaine with 1:200k epi injected incrementally after negative test dose. Reprep prox, ant-med tibia, 10cc 0.5% Bupivacaine 1:200k epi infiltrated subq for supplementation of saphenous nerve. Patient asymptomatic, VSS, no heme aspirated, tolerated well.   Sandford Craze, MD  Popliteal block Procedure Name: LMA Insertion Date/Time: 08/10/2011 1:19 PM Performed by: Gwenyth Allegra Pre-anesthesia Checklist: Patient identified, Timeout performed, Emergency Drugs available, Suction available and Patient being monitored Patient Re-evaluated:Patient Re-evaluated prior to inductionOxygen Delivery Method: Circle system utilized Preoxygenation: Pre-oxygenation with 100% oxygen Intubation Type: IV induction LMA: LMA inserted LMA Size: 4.0 Number of attempts: 1 Placement Confirmation: positive ETCO2 and breath sounds checked- equal and bilateral Tube secured  with: Tape Dental Injury: Teeth and Oropharynx as per pre-operative assessment

## 2011-08-10 NOTE — Brief Op Note (Signed)
08/10/2011  2:47 PM  PATIENT:  Melody Butler  41 y.o. female  PRE-OPERATIVE DIAGNOSIS:  Left ankle lateral malleolus fracture  POST-OPERATIVE DIAGNOSIS:  left ankle lateral malleoulus fracture Weber B. type ankle fracture long oblique spiral of the lateral malleolus with less than 15% posterior malleolar fracture and widening of the medial joint space.  PROCEDURE:  Procedure(s) (LRB): OPEN REDUCTION INTERNAL FIXATION (ORIF) ANKLE FRACTURE (Left) with a 3.5 mm one third semitubular 6-hole plate and a 3.5 lag screw the left lateral malleolus.  SURGEON:  Surgeon(s) and Role:    * Kerrin Champagne, MD - Primary  ANESTHESIA:   regional and general Dr. Jean Rosenthal  EBL:  Total I/O In: 1000 [I.V.:1000] Out: -   BLOOD ADMINISTERED:none  DRAINS: none   LOCAL MEDICATIONS USED:  NONE  SPECIMEN:  No Specimen  DISPOSITION OF SPECIMEN:  N/A  COUNTS:  YES  TOURNIQUET:   Total Tourniquet Time Documented: Thigh (Left) - 60 minutes  DICTATION: .Dragon Dictation  PLAN OF CARE: Admit to inpatient   PATIENT DISPOSITION: PACU Stable condition  Delay start of Pharmacological VTE agent (>24hrs) due to surgical blood loss or risk of bleeding: no

## 2011-08-10 NOTE — Op Note (Addendum)
08/10/2011  2:52 PM  PATIENT:  Melody Butler  41 y.o. female  MRN: [id number]  OPERATIVE REPORT  PRE-OPERATIVE DIAGNOSIS:  Left ankle lateral malleolus fracture Weber B. type ankle fracture long oblique spiral of the lateral malleolus with less than 15% posterior malleolar fracture and widening of the medial joint space   POST-OPERATIVE DIAGNOSIS:  left ankle lateral malleoulus fracture Weber B. type ankle fracture long oblique spiral of the lateral malleolus with less than 15% posterior malleolar fracture and widening of the medial joint space   PROCEDURE:  Procedure(s): OPEN REDUCTION INTERNAL FIXATION (ORIF) ANKLE FRACTUREwith a 3.5 mm one third semitubular 6-hole plate and a 3.5 lag screw the left lateral malleolus.     SURGEON:  Kerrin Champagne, MD     ANESTHESIA:  General, popliteal block by Dr. Jean Rosenthal    COMPLICATIONS:  None.     COMPONENTS: Synthes 3.5 mm one third semitubular 6-hole plate and a 3.5 lag screw the left lateral malleolus.  TOURNIQUET TIME: 60 min @ 250 mmHg  PROCEDURE:The patient was met in the holding area, and the appropriate leftt foot and short leg splint identified and marked with an "X" and my initials.The patient was then transported to OR and was placed on the operative table in a supine position. The patient was then placed under general anesthesia without difficulty. The patient received appropriate preoperative antibiotic prophylaxis Ancef.  Tourniquet was applied to the operative thigh. Leg was then prepped using sterile conditions and draped using sterile technique. A bump placed under the left buttock for internal rotation. Time-out procedure was called and correct  The was elevated and Esmarch exsanguinated with a thigh  tourniquet elevated ot 250 mmHg. incision was then made after timeout over the lateral aspect of the left fibula approximately 8 cm in length based at the expected fibular fracture site approximately 3 cm proximal to the plafond.  Incision was undermined proximally using a periosteal elevator in the subperiosteal plane also distally on the superficial portion of the fibula and posterior aspect. Fracture site identified and carefully opened with slight external rotation of the left leg and debridement of interposed muscle with dental pick. Fracture was then carefully aligned and held in place with reducing tenaculum. Longitudinal traction was necessary to reduce the fracture. mini C-arm was draped sterilely and brought into the field fracture observed to be reduced to anatomicly with the ankle mortise reduced to anatomic alignment.  Fracture held in place with lobster claw bone holding forceps a 3.5 drill hole placed through the anterior cortex of the proximal fragment directed obliquely and then a 2.5 drill bit sleeve placed through the 3.5 hole and the deep cortex drilled with a 2.5 drill. The depth of this hole was then measured and the appropriate size 26 mm length fully threaded cortical screw used to lag the fracture site.  A 6-hole 3.5  one third semitubular  plate was carefully contoured and aligned along the lateral posterior aspect of the crest of the fibula 3 holes on either side of the fracture line. Drill holes were then placed proximal to the fracture line springing of the fibula medially. These screw holes were made first drilling with a 2.5 drill entering depth and placing the appropriate screw.  The 2 distal screws were 4.0 fully threaded cancellus screw placed in neutral position. Mini C-arm was  used to confirm the distal tib-fib syndesmosis appeared reduced. The posterior malleolar fracture fragment reduced nicely and was less than 15% of the joint surface  so no fixation was performed. Distal tib-fib syndesmosis appeared reduced. Irrigation was then carried out. Lateral incision site was then irrigated with copious amounts of irrigant solution the deep subcutaneous layers were then approximated with interrupted 0 Vicryl  sutures more superficial layers with interrupted 2-0 Vicryl sutures and the skin closed with  Dermabond. Adaptic 4 x 4s ABDs pad then fixed to the incision sites with sterile Webril. And a well-padded short leg posterior splint applied with posterior strap and medial and lateral strap. Tourniquet was released total tourniquet time was 60 minutes at 250 mm Hg. All instrument and sponge counts were correct patient was then reactivated extubated returned to her bed and to the recovery room in satisfactory condition.       Dillon Livermore E  08/10/2011, 2:52 PM   And an and a and and and and and and

## 2011-08-11 MED ORDER — ONDANSETRON HCL 4 MG/2ML IJ SOLN: 4.0000 mg | Freq: Three times a day (TID) | INTRAMUSCULAR | Status: DC | PRN

## 2011-08-11 MED ORDER — ASPIRIN 325 MG PO TABS: 325.0000 mg | ORAL_TABLET | Freq: Two times a day (BID) | ORAL | Status: AC

## 2011-08-11 MED ORDER — HYDROCODONE-ACETAMINOPHEN 7.5-325 MG PO TABS: 2.0000 | ORAL_TABLET | ORAL | Status: AC | PRN

## 2011-08-11 NOTE — Progress Notes (Signed)
Per [facility] patient can only have either w/c or rolling walker and not both. Will get w/c for now and once she progresses then she can try for walker. Melody Butler 08/11/2011 420pm

## 2011-08-11 NOTE — Progress Notes (Signed)
Physical Therapy Note   08/11/11 1319  PT Visit Information  Last PT Received On 08/11/11  Assistance Needed +1  PT Time Calculation  PT Start Time 1219  PT Stop Time 1238  PT Time Calculation (min) 19 min  Subjective Data  Subjective I'm still waiting for the doctor  Precautions  Precautions Fall  Restrictions  Weight Bearing Restrictions Yes  RLE Weight Bearing NWB  Cognition  Overall Cognitive Status Appears within functional limits for tasks assessed/performed  Arousal/Alertness Awake/alert  Orientation Level Oriented X4 / Intact  Behavior During Session [facility] for tasks performed  Transfers  Transfers Sit to Stand;Stand to Sit  Sit to Stand 4: Min guard;With upper extremity assist;With armrests;From chair/3-in-1  Stand to Sit 4: Min guard;With upper extremity assist;With armrests;To chair/3-in-1  Details for Transfer Assistance Patient able to recall and demonstrate safe transfers with crutches.  Assist for balance.  Ambulation/Gait  Ambulation/Gait Assistance 4: Min guard  Ambulation Distance (Feet) 8 Feet  Assistive device Crutches  Ambulation/Gait Assistance Details Ambulated from chair to stairs in gym.  Instructing husband on safe way to assist patient.  Gait Pattern Step-to pattern;Decreased step length - left  Gait velocity decreased  Stairs Yes  Stairs Assistance 4: Min assist  Stairs Assistance Details (indicate cue type and reason) Instructed husband on safe guarding technique for stairs.  Patient able to recall sequencing for stairs with crutches.  Stair Management Technique Step to pattern;Forwards;With crutches  Number of Stairs 3   PT - End of Session  Equipment Utilized During Treatment Gait belt  Activity Tolerance Patient limited by fatigue;Patient limited by pain  Patient left in chair;with call bell/phone within reach;with family/visitor present  Nurse Communication Mobility status  PT - Assessment/Plan  Comments on Treatment Session Patient able to  demonstrate safe guarding of patient on stairs.  Patient able to recall correct stairs technique.  Ready for discharge from PT standpoint.  PT Plan Discharge plan remains appropriate;Frequency remains appropriate  PT Frequency Min 6X/week  Follow Up Recommendations No PT follow up;Supervision/Assistance - 24 hour  Equipment Recommended Rolling walker with 5" wheels;Wheelchair (measurements) (with elevating leg rests)  Acute Rehab PT Goals  PT Goal: Sit to Stand - Progress Progressing toward goal  PT Goal: Stand to Sit - Progress Progressing toward goal  PT Goal: Ambulate - Progress Progressing toward goal  PT Goal: Up/Down Stairs - Progress Met  PT General Charges  $$ ACUTE PT VISIT 1 Procedure  PT Treatments  $Gait Training 8-22 mins   Durenda Hurt. Renaldo Fiddler, [facility] Acute Rehab Services Pager [phone]

## 2011-08-11 NOTE — Progress Notes (Signed)
  CARE MANAGEMENT NOTE 08/11/2011  Patient:  DESTIN, KITTLER   Account Number:  [id number]  Date Initiated:  08/11/2011  Documentation initiated by:  Hoy Register.  Subjective/Objective Assessment:     Action/Plan:   Anticipated DC Date:  08/11/2011   Anticipated DC Plan:  HOME/SELF CARE      DC Planning Services  CM consult      Choice offered to / List presented to:     DME arranged  WHEELCHAIR - MANUAL  WALKER - ROLLING  BEDSIDE COMMODE      DME agency  [facility]        Status of service:  Completed, signed off Medicare Important Message given?   (If response is "NO", the following Medicare IM given date fields will be blank) Date Medicare IM given:   Date Additional Medicare IM given:    Discharge Disposition:  HOME/SELF CARE  Per UR Regulation:    If discussed at Long Length of Stay Meetings, dates discussed:    Comments:  08/11/11 345pm GHoward,RN,BC,BSN received orders for equipment and patient is ready for discharge. called [facility] not delivering today) and faxed orders. Driver will call if does not have equipment in warehouse.

## 2011-08-11 NOTE — Evaluation (Signed)
Physical Therapy Evaluation Patient Details Name: Melody Butler MRN: [id number] DOB: 1970-10-22 Today's Date: 08/11/2011 Time: 1021-1059 PT Time Calculation (min): 38 min  PT Assessment / Plan / Recommendation Clinical Impression  Patient limited by pain and fatigue today.  Will need to practice stairs with husband.  Recommend RW and w/c with elevating leg rests for discharge.  Will follow acutely for functional mobility/gait training and education.    PT Assessment  Patient needs continued PT services    Follow Up Recommendations  No PT follow up;Supervision/Assistance - 24 hour    Barriers to Discharge None      Equipment Recommendations  Rolling walker with 5" wheels;Wheelchair (measurements) (with elevating leg rests)    Recommendations for Other Services     Frequency Min 6X/week    Precautions / Restrictions Precautions Precautions: Fall Restrictions Weight Bearing Restrictions: Yes RLE Weight Bearing: Non weight bearing         Mobility  Bed Mobility Bed Mobility: Supine to Sit;Sitting - Scoot to Edge of Bed Supine to Sit: 4: Min assist;HOB flat Sitting - Scoot to Edge of Bed: 5: Supervision Details for Bed Mobility Assistance: Assist to move RLE off bed.  Cues for safety Transfers Transfers: Sit to Stand;Stand to Sit Sit to Stand: 4: Min assist;With upper extremity assist;From bed;From chair/3-in-1;With armrests Stand to Sit: 4: Min assist;With upper extremity assist;With armrests;To chair/3-in-1 Details for Transfer Assistance: Verbal cues for hand placement with use of RW and with use of crutches.  Assist for balance due to NWB status. Ambulation/Gait Ambulation/Gait Assistance: 4: Min assist Ambulation Distance (Feet): 54 Feet Assistive device: Rolling walker;Crutches Ambulation/Gait Assistance Details: Patient ambulated 15' with RW with min guard assist.  Patient then instructed in ambulation with crutches - 6' with min assist. More stable with RW. Gait  Pattern: Step-to pattern;Decreased step length - left (NWB RLE) Gait velocity: decreased Stairs: Yes Stairs Assistance: 4: Min assist Stairs Assistance Details (indicate cue type and reason): Instructed patient on technique for stairs with crutches.   Stair Management Technique: Step to pattern;Forwards;With crutches Number of Stairs: 3     Exercises     PT Diagnosis: Difficulty walking;Generalized weakness;Acute pain  PT Problem List: Decreased strength;Decreased range of motion;Decreased activity tolerance;Decreased balance;Decreased mobility;Decreased knowledge of use of DME;Decreased knowledge of precautions;Pain PT Treatment Interventions: DME instruction;Gait training;Stair training;Functional mobility training;Patient/family education   PT Goals Acute Rehab PT Goals PT Goal Formulation: With patient Time For Goal Achievement: 08/13/11 Potential to Achieve Goals: Good Pt will go Supine/Side to Sit: Independently;with HOB 0 degrees PT Goal: Supine/Side to Sit - Progress: Goal set today Pt will go Sit to Supine/Side: Independently;with HOB 0 degrees PT Goal: Sit to Supine/Side - Progress: Goal set today Pt will go Sit to Stand: with supervision;with upper extremity assist PT Goal: Sit to Stand - Progress: Goal set today Pt will go Stand to Sit: with supervision;with upper extremity assist PT Goal: Stand to Sit - Progress: Goal set today Pt will Ambulate: 51 - 150 feet;with supervision;with least restrictive assistive device PT Goal: Ambulate - Progress: Goal set today Pt will Go Up / Down Stairs: 3-5 stairs;with min assist;with least restrictive assistive device PT Goal: Up/Down Stairs - Progress: Goal set today  Visit Information  Last PT Received On: 08/11/11 Assistance Needed: +1    Subjective Data  Subjective: I'm supposed to go home today Patient Stated Goal: To go home   Prior Functioning  Home Living Lives With: Family Available Help at Discharge: Available 24  hours/day;Family (Patient's mother) Type of Home: House Home Access: Stairs to enter [employer] of Steps: 1 Entrance Stairs-Rails: None Home Layout: Two level Alternate Level Stairs-Number of Steps: flight Alternate Level Stairs-Rails: Left Bathroom Shower/Tub: Health visitor: Standard Home Adaptive Equipment: Crutches Prior Function Level of Independence: Independent Able to Take Stairs?: Yes Driving: Yes Vocation: Unemployed Communication Communication: No difficulties Dominant Hand: Right    Cognition  Overall Cognitive Status: Appears within functional limits for tasks assessed/performed Arousal/Alertness: Awake/alert Orientation Level: Oriented X4 / Intact Behavior During Session: WFL for tasks performed    Extremity/Trunk Assessment Right Upper Extremity Assessment RUE ROM/Strength/Tone: Within functional levels RUE Sensation: WFL - Light Touch Left Upper Extremity Assessment LUE ROM/Strength/Tone: Within functional levels LUE Sensation: WFL - Light Touch Right Lower Extremity Assessment RLE ROM/Strength/Tone: Unable to fully assess;Due to pain;Deficits RLE ROM/Strength/Tone Deficits: Moves LE against gravity.  Short leg cast in place Left Lower Extremity Assessment LLE ROM/Strength/Tone: Within functional levels LLE Sensation: WFL - Light Touch   Balance    End of Session PT - End of Session Equipment Utilized During Treatment: Gait belt Activity Tolerance: Patient limited by fatigue;Patient limited by pain Patient left: in chair;with call bell/phone within reach;with family/visitor present Nurse Communication: Mobility status (Discharge equipment)  GP     Vena Austria 08/11/2011, 1:18 PM Durenda Hurt. Renaldo Fiddler, [facility] Acute Rehab Services Pager [phone]

## 2011-08-13 ENCOUNTER — Encounter (HOSPITAL_COMMUNITY): Payer: Self-pay | Admitting: Specialist

## 2011-08-13 NOTE — Discharge Summary (Signed)
Physician Discharge Summary  Patient ID: Melody Butler MRN: [id number] DOB/AGE: [birth year] 41 y.o.  Admit date: 08/10/2011 Discharge date: 08/11/2011  Admission Diagnoses:  Principal Problem:  *Displaced bimalleolar fracture of right lower leg   Discharge Diagnoses:  Same  Past Medical History  Diagnosis Date  . Bicornate uterus   . Breast mass, left 06/2007  . Acne   . Constipation, chronic     Surgeries: Procedure(s): OPEN REDUCTION INTERNAL FIXATION (ORIF) ANKLE FRACTURE on 08/10/2011   Consultants:  None  Discharged Condition: Improved  Hospital Course: Melody Butler is an 41 y.o. female who was admitted 08/10/2011 with a chief complaint of No chief complaint on file. , and found to have a diagnosis of Displaced bimalleolar fracture of right lower leg.  She was brought to the operating room on 08/10/2011 and underwent the above named procedures.   She was given perioperative antibiotics:  Anti-infectives     Start     Dose/Rate Route Frequency Ordered Stop   08/10/11 1900   ceFAZolin (ANCEF) IVPB 1 g/50 mL premix        1 g 100 mL/hr over 30 Minutes Intravenous Every 6 hours 08/10/11 1650 08/11/11 0723   08/09/11 1421   ceFAZolin (ANCEF) IVPB 2 g/50 mL premix        2 g 100 mL/hr over 30 Minutes Intravenous 60 min pre-op 08/09/11 1421 08/10/11 1300        .  She was given sequential compression devices, early ambulation, and chemoprophylaxis for DVT prophylaxis. Underwent physical therapy on post op day #1 a rolling and Wheelchair were ordered. [employer] working with the patient Would only approve one or the other so a W/C was ordered And a rolling walker. Physical therapy discharged the patient  As appropriate for going home. She was then discharged home on 08/11/2011.   She benefited maximally from their hospital stay and there were no complications.    Recent vital signs:  Filed Vitals:   08/11/11 1300  BP: 96/50  Pulse: 95  Temp: 98.9 F (37.2 C)  Resp: 20      Recent laboratory studies:  Results for orders placed during the hospital encounter of 08/09/11  SURGICAL PCR SCREEN      Component Value Range   MRSA, PCR NEGATIVE  NEGATIVE   Staphylococcus aureus POSITIVE (*) NEGATIVE  HCG, SERUM, QUALITATIVE      Component Value Range   Preg, Serum NEGATIVE  NEGATIVE  BASIC METABOLIC PANEL      Component Value Range   Sodium 136  135 - 145 mEq/L   Potassium 4.6  3.5 - 5.1 mEq/L   Chloride 104  96 - 112 mEq/L   CO2 25  19 - 32 mEq/L   Glucose, Bld 96  70 - 99 mg/dL   BUN 12  6 - 23 mg/dL   Creatinine, Ser 8.65  0.50 - 1.10 mg/dL   Calcium 9.0  8.4 - 78.4 mg/dL   GFR calc non Af Amer >90  >90 mL/min   GFR calc Af Amer >90  >90 mL/min  CBC      Component Value Range   WBC 7.7  4.0 - 10.5 K/uL   RBC 3.79 (*) 3.87 - 5.11 MIL/uL   Hemoglobin 11.2 (*) 12.0 - 15.0 g/dL   HCT 69.6 (*) 29.5 - 28.4 %   MCV 88.4  78.0 - 100.0 fL   MCH 29.6  26.0 - 34.0 pg   MCHC 33.4  30.0 - 36.0 g/dL  RDW 13.7  11.5 - 15.5 %   Platelets 308  150 - 400 K/uL    Discharge Medications:   Medication List  As of 08/13/2011 10:22 AM   STOP taking these medications         HYDROcodone-acetaminophen 5-325 MG per tablet         TAKE these medications         aspirin 325 MG tablet   Take 1 tablet (325 mg total) by mouth 2 (two) times daily after a meal.      HYDROcodone-acetaminophen 7.5-325 MG per tablet   Commonly known as: NORCO   Take 2 tablets by mouth every 4 (four) hours as needed for pain.      ISOtretinoin 40 MG capsule   Commonly known as: ACCUTANE   Take 40 mg by mouth daily.      meloxicam 15 MG tablet   Commonly known as: MOBIC   Take 15 mg by mouth daily as needed. For pain      oxyCODONE 5 MG immediate release tablet   Commonly known as: Oxy IR/ROXICODONE   Take 1-2 tablets (5-10 mg total) by mouth every 4 (four) hours as needed.            Diagnostic Studies: Dg Ankle Left Port  08/10/2011  *RADIOLOGY REPORT*  Clinical Data:  ORIF left ankle.  PORTABLE LEFT ANKLE - 2 VIEW  Comparison: None.  Findings: Postoperative changes with internal fixation of the distal fibular fracture.  Near anatomic alignment.  No hardware complicating feature.  IMPRESSION: Internal fixation of the distal fibular fracture.  Original Report Authenticated By: Cyndie Chime, M.D.    Disposition: 01-Home or Self Care  Discharge Orders    Future Orders Please Complete By Expires   Diet - low sodium heart healthy      Call MD / Call 911      Comments:   If you experience chest pain or shortness of breath, CALL 911 and be transported to the hospital emergency room.  If you develope a fever above 101 F, pus (white drainage) or increased drainage or redness at the wound, or calf pain, call your surgeon's office.   Constipation Prevention      Comments:   Drink plenty of fluids.  Prune juice may be helpful.  You may use a stool softener, such as Colace (over the counter) 100 mg twice a day.  Use MiraLax (over the counter) for constipation as needed.   Increase activity slowly as tolerated      Discharge instructions      Comments:   Keep short leg splint and dressing dry. May use water impervious bag or cast bag and tub chair to shower Tape the top of bag to skin to avoid moisture soaking the dressing on the leg. Call if there is odor or saturation of dressing or worsening pain not controlled with medications. Call if fever greater than 101.5. Use crutches or walker no weight bearing on the ankle fracture leg. Please follow up with an appointment with Dr. Otelia Sergeant  2 weeks from the time of surgery. Elevate as often as possible during the first week after surgery gradually increasing the time the leg is dependent or down there after. If swelling recurrs then elevate again. Wheel chair for longer distances. Take Aspirin 325 mg twice a day with meal or snack.   Driving restrictions      Comments:   No driving for 3 weeks   Lifting restrictions  Comments:   No lifting for 6 weeks         Signed: Jamy Whyte E 08/13/2011, 10:22 AM

## 2011-11-15 ENCOUNTER — Ambulatory Visit
Admission: RE | Admit: 2011-11-15 | Discharge: 2011-11-15 | Disposition: A | Discharge status: Home / Self Care | Payer: BC Managed Care – PPO | Source: Ambulatory Visit | Attending: Specialist | Admitting: Specialist

## 2011-11-15 ENCOUNTER — Other Ambulatory Visit: Payer: Self-pay | Admitting: Specialist

## 2011-11-15 DIAGNOSIS — R52 Pain, unspecified: Secondary | ICD-10-CM

## 2012-04-10 ENCOUNTER — Telehealth: Payer: Self-pay

## 2012-04-10 NOTE — Telephone Encounter (Signed)
Spoke to patient and told her we do have documentation in her paper chart that she was seen at [facility] in 2009. She said that was all she needed to know.

## 2012-04-10 NOTE — Telephone Encounter (Signed)
PT HAD DONE A CREDIT REPORT AND FOUND AN OUTSTANDING BILL ON HER REPORT FOR $85 FROM [facility].  THEY TOLD HER SHE HAD IT DONE AT [facility] AND THAT WE WERE THE ONES WHO SENT HER THERE.  THIS HAPPENED IN 2009.  SHE SAYS SHE NEVER WENT FOR THIS AND WANTS [place] TO CHECK HER RECORD FOR A REFERRAL.  SHE BELIEVES SOMEONE BILLED HER INCORRECTLY.  PLEASE CALL HER BACK AT [phone]. [facility] # IS [id number]

## 2012-05-06 ENCOUNTER — Encounter: Payer: Self-pay | Admitting: Family Medicine

## 2012-07-03 ENCOUNTER — Encounter: Payer: BC Managed Care – PPO | Admitting: Physician Assistant

## 2012-10-07 ENCOUNTER — Encounter: Payer: Self-pay | Admitting: Physician Assistant

## 2012-10-07 ENCOUNTER — Ambulatory Visit (INDEPENDENT_AMBULATORY_CARE_PROVIDER_SITE_OTHER): Payer: BC Managed Care – PPO | Admitting: Physician Assistant

## 2012-10-07 VITALS — BP 118/60 | HR 68 | Temp 98.2°F | Resp 16 | Ht 62.5 in | Wt 135.8 lb

## 2012-10-07 DIAGNOSIS — N920 Excessive and frequent menstruation with regular cycle: Secondary | ICD-10-CM

## 2012-10-07 DIAGNOSIS — Q513 Bicornate uterus: Secondary | ICD-10-CM | POA: Insufficient documentation

## 2012-10-07 DIAGNOSIS — N6320 Unspecified lump in the left breast, unspecified quadrant: Secondary | ICD-10-CM

## 2012-10-07 DIAGNOSIS — Z23 Encounter for immunization: Secondary | ICD-10-CM

## 2012-10-07 DIAGNOSIS — N63 Unspecified lump in unspecified breast: Secondary | ICD-10-CM

## 2012-10-07 DIAGNOSIS — Z0000 Encounter for general adult medical examination without abnormal findings: Secondary | ICD-10-CM

## 2012-10-07 LAB — POCT UA - MICROSCOPIC ONLY
Bacteria, U Microscopic: NEGATIVE
Crystals, Ur, HPF, POC: NEGATIVE
WBC, Ur, HPF, POC: NEGATIVE

## 2012-10-07 LAB — POCT URINALYSIS DIPSTICK
Bilirubin, UA: NEGATIVE
Ketones, UA: NEGATIVE
Leukocytes, UA: NEGATIVE
Protein, UA: NEGATIVE
Spec Grav, UA: <=1.005
pH, UA: 6.5

## 2012-10-07 LAB — CBC WITH DIFFERENTIAL/PLATELET
Eosinophils Absolute: 0.1 10*3/uL (ref 0.0–0.7)
Hemoglobin: 12.7 g/dL (ref 12.0–15.0)
Lymphocytes Relative: 31 % (ref 12–46)
Lymphs Abs: 1.8 10*3/uL (ref 0.7–4.0)
MCH: 29.1 pg (ref 26.0–34.0)
Monocytes Relative: 10 % (ref 3–12)
Neutro Abs: 3.2 10*3/uL (ref 1.7–7.7)
Neutrophils Relative %: 58 % (ref 43–77)
RBC: 4.36 MIL/uL (ref 3.87–5.11)
WBC: 5.6 10*3/uL (ref 4.0–10.5)

## 2012-10-07 NOTE — Progress Notes (Signed)
Subjective:    Patient ID: Melody Butler, female    DOB: 18-Mar-1970, 42 y.o.   MRN: [id number]  HPI Melody Butler is a 42 year old female here today for her physical. She complains of heavy menstrual bleeding that last 8 days and seems to be coming early. Her periods are about every 22 days. She reports being extremely fatigued, emotional, and achey during her period. She has noted blood clots in the pads. She has to change her pad frequently during the start of her period. She has not seen GYN since being pregnant. She has tried numerous OCP which she did not tolerate well- increased headaches and acne, increased weight, mood swings.   She still suffers from left ankle pain and leg pain on occasion after her ankle fracture. She is being followed by orthopedics who may repeat her surgery soon.  Melody Butler continues to smoke 1-2 cigarettes a day and drinks alcohol socially. She is married and has no concerns for STI. She does not take any chronic medications.  Pap and HPV: 06/2011, normal Mammogram: 03/2012, stable, repeat in one year, Flu: today  Review of Systems  Constitutional: Negative for fever, chills, activity change, appetite change, fatigue and unexpected weight change.  HENT: Negative for hearing loss, congestion, sore throat, facial swelling, rhinorrhea, trouble swallowing, neck pain, dental problem, postnasal drip and ear discharge.   Eyes: Negative for photophobia, itching and visual disturbance. Eye discharge: increase in watery eyes.  Respiratory: Negative for cough, chest tightness and shortness of breath.   Cardiovascular: Negative for chest pain and palpitations.  Gastrointestinal: Positive for diarrhea and constipation. Negative for nausea and vomiting.  Endocrine: Negative for cold intolerance, heat intolerance, polydipsia, polyphagia and polyuria.  Genitourinary: Positive for vaginal discharge (unchanged for years) and menstrual problem. Negative for dysuria, urgency,  frequency, hematuria, vaginal bleeding, difficulty urinating and pelvic pain.  Allergic/Immunologic: Negative for environmental allergies and food allergies.  Neurological: Negative for dizziness and headaches.  Hematological: Does not bruise/bleed easily.  Psychiatric/Behavioral: Negative.        Objective:   Physical Exam  Constitutional: She is oriented to person, place, and time. She appears well-developed and well-nourished.  HENT:  Head: Normocephalic and atraumatic.  Right Ear: Hearing, tympanic membrane, external ear and ear canal normal.  Left Ear: Hearing, tympanic membrane, external ear and ear canal normal.  Nose: Nose normal.  Mouth/Throat: Uvula is midline, oropharynx is clear and moist and mucous membranes are normal.  Eyes: Conjunctivae, EOM and lids are normal. Pupils are equal, round, and reactive to light. Right eye exhibits no discharge and no exudate. Left eye exhibits no discharge and no exudate. No scleral icterus.  Fundoscopic exam:      The right eye shows no arteriolar narrowing, no AV nicking, no exudate, no hemorrhage and no papilledema. The right eye shows red reflex.       The left eye shows no arteriolar narrowing, no AV nicking, no exudate, no hemorrhage and no papilledema. The left eye shows red reflex.  Neck: Trachea normal, normal range of motion and full passive range of motion without pain. No mass present.  Cardiovascular: Normal rate, regular rhythm, normal heart sounds and normal pulses.   Pulmonary/Chest: Effort normal and breath sounds normal. She exhibits tenderness (right axilla and left lateral breast, stable for patient). She exhibits no mass, no bony tenderness and no deformity. Right breast exhibits tenderness. Right breast exhibits no inverted nipple, no mass, no nipple discharge and no skin change. Left  breast exhibits mass (2 cm cystic lesion, mobile on left lateral breast. painful to palpation during menses, at patient's base line) and  tenderness. Left breast exhibits no inverted nipple, no nipple discharge and no skin change. Breasts are symmetrical.  B/l scars on breasts from breast reduction surgery, no areas of errythema or infection  Abdominal: Normal appearance and bowel sounds are normal. There is no hepatosplenomegaly. There is no tenderness.  Genitourinary: There is breast tenderness. No breast swelling, discharge or bleeding.  Musculoskeletal: Normal range of motion.  Lymphadenopathy:    She has no cervical adenopathy.  Neurological: She is alert and oriented to person, place, and time. She has normal strength and normal reflexes. No cranial nerve deficit or sensory deficit. Coordination normal.  Skin: Skin is warm and dry.  Psychiatric: She has a normal mood and affect. Her speech is normal and behavior is normal.     BP 118/60  Pulse 68  Temp(Src) 98.2 F (36.8 C) (Oral)  Resp 16  Ht 5' 2.5" (1.588 m)  Wt 135 lb 12.8 oz (61.598 kg)  BMI 24.43 kg/m2  SpO2 100%     Assessment & Plan:  Annual physical exam - Plan: CBC with Differential, Comprehensive metabolic panel, Lipid panel, TSH, POCT UA - Microscopic Only, POCT urinalysis dipstick  Need for influenza vaccination - Plan: Flu Vaccine QUAD 36+ mos IM  Menorrhagia with regular cycle - Plan: Ambulatory referral to Gynecology

## 2012-10-07 NOTE — Progress Notes (Signed)
  Subjective:    Patient ID: Melody Butler, female    DOB: 1970/12/12, 42 y.o.   MRN: [id number]  HPI    Review of Systems  Constitutional: Negative.   HENT: Positive for neck pain.   Eyes: Negative.   Respiratory: Negative.   Cardiovascular: Negative.   Gastrointestinal: Positive for constipation.  Genitourinary: Positive for vaginal discharge.  Skin: Negative.   Allergic/Immunologic: Negative.   Neurological: Negative.   Hematological: Negative.   Psychiatric/Behavioral: Negative.        Objective:   Physical Exam        Assessment & Plan:

## 2012-10-07 NOTE — Patient Instructions (Addendum)
I will contact you with your lab results as soon as they are available.   If you have not heard from me in 2 weeks, please contact me.  The fastest way to get your results is to register for My Chart (see the instructions on the last page of this printout).  If you have not heard anything regarding the referral in 1-2 weeks, please contact our office.  Keeping You Healthy  Get These Tests 1. Blood Pressure- Have your blood pressure checked once a year by your health care provider.  Normal blood pressure is 120/80. 2. Weight- Have your body mass index (BMI) calculated to screen for obesity.  BMI is measure of body fat based on height and weight.  You can also calculate your own BMI at [email or web address]://www.west-esparza.com/. 3. Cholesterol- Have your cholesterol checked every 5 years starting at age 25 then yearly starting at age 6. 4. Chlamydia, HIV, and other sexually transmitted diseases- Get screened every year until age 74, then within three months of each new sexual provider. 5. Pap Smear- Every 1-3 years; discuss with your health care provider. 6. Mammogram- Every year starting at age 66  Take these medicines  Calcium with Vitamin D-Your body needs 1200 mg of Calcium each day and [phone] IU of Vitamin D daily.  Your body can only absorb 500 mg of Calcium at a time so Calcium must be taken in 2 or 3 divided doses throughout the day.  Multivitamin with folic acid- Once daily if it is possible for you to become pregnant.  Get these Immunizations  Gardasil-Series of three doses; prevents HPV related illness such as genital warts and cervical cancer.  Menactra-Single dose; prevents meningitis.  Tetanus shot- Every 10 years.  Flu shot-Every year.  Take these steps 1. Do not smoke-Your healthcare provider can help you quit.  For tips on how to quit go to www.smokefree.gov or call 1-800 QUITNOW. 2. Be physically active- Exercise 5 days a week for at least 30 minutes.  If you are not already  physically active, start slow and gradually work up to 30 minutes of moderate physical activity.  Examples of moderate activity include walking briskly, dancing, swimming, bicycling, etc. 3. Breast Cancer- A self breast exam every month is important for early detection of breast cancer.  For more information and instruction on self breast exams, ask your healthcare provider or [email or web address]. 4. Eat a healthy diet- Eat a variety of healthy foods such as fruits, vegetables, whole grains, low fat milk, low fat cheeses, yogurt, lean meats, poultry and fish, beans, nuts, tofu, etc.  For more information go to www. Thenutritionsource.org 5. Drink alcohol in moderation- Limit alcohol intake to one drink or less per day. Never drink and drive. 6. Depression- Your emotional health is as important as your physical health.  If you're feeling down or losing interest in things you normally enjoy please talk to your healthcare provider about being screened for depression. 7. Dental visit- Brush and floss your teeth twice daily; visit your dentist twice a year. 8. Eye doctor- Get an eye exam at least every 2 years. 9. Helmet use- Always wear a helmet when riding a bicycle, motorcycle, rollerblading or skateboarding. 10. Safe sex- If you may be exposed to sexually transmitted infections, use a condom. 11. Seat belts- Seat belts can save your live; always wear one. 12. Smoke/Carbon Monoxide detectors- These detectors need to be installed on the appropriate level of your home. Replace batteries at least once a year. 13.  Skin cancer- When out in the sun please cover up and use sunscreen 15 SPF or higher. 14. Violence- If anyone is threatening or hurting you, please tell your healthcare provider.

## 2012-10-08 ENCOUNTER — Encounter: Payer: Self-pay | Admitting: Physician Assistant

## 2012-10-08 LAB — COMPREHENSIVE METABOLIC PANEL
ALT: 9 U/L (ref 0–35)
Albumin: 4.4 g/dL (ref 3.5–5.2)
CO2: 26 mEq/L (ref 19–32)
Chloride: 107 mEq/L (ref 96–112)
Glucose, Bld: 86 mg/dL (ref 70–99)
Potassium: 4.2 mEq/L (ref 3.5–5.3)
Sodium: 139 mEq/L (ref 135–145)
Total Protein: 6.9 g/dL (ref 6.0–8.3)

## 2012-10-08 LAB — LIPID PANEL
Cholesterol: 188 mg/dL (ref 0–200)
Triglycerides: 100 mg/dL (ref <150)

## 2012-10-08 NOTE — Progress Notes (Signed)
I have examined this patient along with the student and agree.  

## 2012-10-16 ENCOUNTER — Telehealth: Payer: Self-pay | Admitting: Gynecology

## 2012-10-16 NOTE — Telephone Encounter (Signed)
Patient said she just received a call from our office. I did not see a phone note and informed her it may have been a reminder call for her appointment next week.

## 2012-10-21 ENCOUNTER — Ambulatory Visit (INDEPENDENT_AMBULATORY_CARE_PROVIDER_SITE_OTHER): Payer: BC Managed Care – PPO | Admitting: Gynecology

## 2012-10-21 ENCOUNTER — Encounter: Payer: Self-pay | Admitting: Gynecology

## 2012-10-21 VITALS — BP 106/66 | HR 80 | Resp 18 | Ht 62.5 in | Wt 134.0 lb

## 2012-10-21 DIAGNOSIS — Q51810 Arcuate uterus: Secondary | ICD-10-CM

## 2012-10-21 DIAGNOSIS — Z124 Encounter for screening for malignant neoplasm of cervix: Secondary | ICD-10-CM

## 2012-10-21 DIAGNOSIS — Z01419 Encounter for gynecological examination (general) (routine) without abnormal findings: Secondary | ICD-10-CM

## 2012-10-21 DIAGNOSIS — N946 Dysmenorrhea, unspecified: Secondary | ICD-10-CM

## 2012-10-21 DIAGNOSIS — N920 Excessive and frequent menstruation with regular cycle: Secondary | ICD-10-CM

## 2012-10-21 NOTE — Progress Notes (Signed)
42 y.o. Married Hispanic female   G2P2002 here for annual exam. Pt is currently sexually active.  Pt has history of chronic constipation and has had success with miralax, just started chia and has had good results.  Pt reports that cycles are 25d for 8d, very heavy.  Using pads changes every 2-3h but will wear 3 pads simultaneous, for 4d, clots, and increase in pain mostly to back can cause pt to lie in bed for 3d,  Motrin 600mg  every day.  Doesn't like to take medications.  Denies dyspareunia.  History of headache-dull on ocp but not migraine.  Pt recently had labs at PCP normal TSH,CBC.  U/S 2009 no fibroids  Patient's last menstrual period was 10/10/2012.          Sexually active: yes  The current method of family planning is partner has a Vasectomy.    Exercising: yes  walking mon-thurs 4/wk Last pap:  06/2011- Negative Alcohol: once in awhile Tobacco:5 cigs/wk BSE: sometimes  Hgb: PCP ; Urine: PCP    Health Maintenance  Topic Date Due  . Influenza Vaccine  08/01/2013  . Pap Smear  01/01/2014  . Tetanus/tdap  12/18/2017    Family History  Problem Relation Age of Onset  . Hypertension Mother   . Cancer Maternal Grandmother     breast CA  . Cancer Paternal Grandfather     prostate CA  . Hypertension Maternal Grandfather   . Cancer Paternal Grandmother 85    Patient Active Problem List   Diagnosis Date Noted  . 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 Breast mass, left 10/07/2012  . Displaced bimalleolar fracture of right lower leg 08/10/2011    Class: Acute    Past Medical History  Diagnosis Date  . Bicornate uterus   . Breast mass, left 06/2007  . Acne   . Constipation, chronic     Past Surgical History  Procedure Laterality Date  . Breast reduction surgery  2002  . Orif ankle fracture  08/10/2011    Procedure: OPEN REDUCTION INTERNAL FIXATION (ORIF) ANKLE FRACTURE;  Surgeon: Kerrin Champagne, MD;  Location: [facility] OR;  Service: Orthopedics;  Laterality: Left;  Open Reduction  Internal Fixation left ankle with plate and screws  . Wisdom tooth extraction      x2   . Cesarean section      x2    Allergies: Estrogens  Current Outpatient Prescriptions  Medication Sig Dispense Refill  . ISOtretinoin (ACCUTANE) 40 MG capsule Take 40 mg by mouth daily.      . meloxicam (MOBIC) 15 MG tablet Take 15 mg by mouth daily as needed. For pain       No current facility-administered medications for this visit.    ROS: Pertinent items are noted in HPI.  Exam:    BP 106/66  Pulse 80  Resp 18  Ht 5' 2.5" (1.588 m)  Wt 134 lb (60.782 kg)  BMI 24.1 kg/m2  LMP 10/10/2012 Weight change: @WEIGHTCHANGE @ Last 3 height recordings:  Ht Readings from Last 3 Encounters:  10/21/12 5' 2.5" (1.588 m)  10/07/12 5' 2.5" (1.588 m)  08/11/11 5\' 2"  (1.575 m)   General appearance: alert, cooperative and appears stated age Head: Normocephalic, without obvious abnormality, atraumatic Neck: no adenopathy, no carotid bruit, no JVD, supple, symmetrical, trachea midline and thyroid not enlarged, symmetric, no tenderness/mass/nodules Lungs: clear to auscultation bilaterally Breasts: normal appearance, no masses or tenderness Heart: regular rate and rhythm, S1, S2 normal, no murmur, click, rub or gallop  Abdomen: soft, non-tender; bowel sounds normal; no masses,  no organomegaly Extremities: extremities normal, atraumatic, no cyanosis or edema Skin: Skin color, texture, turgor normal. No rashes or lesions Lymph nodes: Cervical, supraclavicular, and axillary nodes normal. no inguinal nodes palpated Neurologic: Grossly normal   Pelvic: External genitalia:  no lesions              Urethra: normal appearing urethra with no masses, tenderness or lesions              Bartholins and Skenes: normal                 Vagina: normal appearing vagina with normal color and discharge, no lesions              Cervix: normal appearance              Pap taken: yes        Bimanual Exam:  Uterus:  uterus  is normal size, shape, consistency and nontender, retroverted                                      Adnexa:    normal adnexa in size, nontender and no masses                                      Rectovaginal: Confirms                                      Anus:  normal sphincter tone, no lesions  A: well woman Menorrhagia-normal TSH,CBC Arcuate uterus  P: mammogram annually  pap smear with HRHPV Reviewed prior u/s and labs, discussed placement of Mirena IUD, will check SHG due to arcuate uterus to confirm ok for placment, pt reports uterine abnormality was reason for c/s's, last u/s opines septum? possible EMB  counseled on breast self exam, mammography screening, adequate intake of calcium and vitamin D, diet and exercise return annually or prn   An After Visit Summary was printed and given to the patient.

## 2012-10-21 NOTE — Patient Instructions (Addendum)
Sonohysterography Care After These instructions give you information on caring for yourself after your procedure. Your doctor may also give you more specific instructions. Call your doctor if you have any problems or questions after your procedure. HOME CARE  Take your medicine as told. Finish them even if you start to feel better.  Wear a pad if you have some light bleeding. This is normal.  Do not use tampons, douche, or have sex (intercourse) for 1 week. Finding out the results of your test Ask when your test results will be ready. Make sure you get your test results. GET HELP RIGHT AWAY IF:  You have a temperature by mouth above 102 F (38.9 C), not controlled by medicine.  You have pain in your belly (abomen) that gets worse or is very bad.  You have a lot of bleeding from your vagina. MAKE SURE YOU:  Understand these instructions.  Will watch your condition.  Will get help right away if you are not doing well or get worse. Document Released: 01/20/2010 Document Revised: 03/12/2011 Document Reviewed: 01/20/2010 [facility] Patient Information 2014 [place], [place].   Levonorgestrel intrauterine device (IUD) What is this medicine? LEVONORGESTREL IUD (LEE voe nor jes trel) is a contraceptive (birth control) device. It is used to prevent pregnancy and to treat heavy bleeding that occurs during your period. It can be used for up to 5 years. This medicine may be used for other purposes; ask your health care provider or pharmacist if you have questions. What should I tell my health care provider before I take this medicine? They need to know if you have any of these conditions: -abnormal Pap smear -cancer of the breast, uterus, or cervix -diabetes -endometritis -genital or pelvic infection now or in the past -have more than one sexual partner or your partner has more than one partner -heart disease -history of an ectopic or tubal pregnancy -immune system problems -IUD in  place -liver disease or tumor -problems with blood clots or take blood-thinners -use intravenous drugs -uterus of unusual shape -vaginal bleeding that has not been explained -an unusual or allergic reaction to levonorgestrel, other hormones, silicone, or polyethylene, medicines, foods, dyes, or preservatives -pregnant or trying to get pregnant -breast-feeding How should I use this medicine? This device is placed inside the uterus by a health care professional. Talk to your pediatrician regarding the use of this medicine in children. Special care may be needed. Overdosage: If you think you have taken too much of this medicine contact a poison control center or emergency room at once. NOTE: This medicine is only for you. Do not share this medicine with others. What if I miss a dose? This does not apply. What may interact with this medicine? Do not take this medicine with any of the following medications: -amprenavir -bosentan -fosamprenavir This medicine may also interact with the following medications: -aprepitant -barbiturate medicines for inducing sleep or treating seizures -bexarotene -griseofulvin -medicines to treat seizures like carbamazepine, ethotoin, felbamate, oxcarbazepine, phenytoin, topiramate -modafinil -pioglitazone -rifabutin -rifampin -rifapentine -some medicines to treat HIV infection like atazanavir, indinavir, lopinavir, nelfinavir, tipranavir, ritonavir -St. John's wort -warfarin This list may not describe all possible interactions. Give your health care provider a list of all the medicines, herbs, non-prescription drugs, or dietary supplements you use. Also tell them if you smoke, drink alcohol, or use illegal drugs. Some items may interact with your medicine. What should I watch for while using this medicine? Visit your doctor or health care professional for regular check  ups. See your doctor if you or your partner has sexual contact with others, becomes HIV  positive, or gets a sexual transmitted disease. This product does not protect you against HIV infection (AIDS) or other sexually transmitted diseases. You can check the placement of the IUD yourself by reaching up to the top of your vagina with clean fingers to feel the threads. Do not pull on the threads. It is a good habit to check placement after each menstrual period. Call your doctor right away if you feel more of the IUD than just the threads or if you cannot feel the threads at all. The IUD may come out by itself. You may become pregnant if the device comes out. If you notice that the IUD has come out use a backup birth control method like condoms and call your health care provider. Using tampons will not change the position of the IUD and are okay to use during your period. What side effects may I notice from receiving this medicine? Side effects that you should report to your doctor or health care professional as soon as possible: -allergic reactions like skin rash, itching or hives, swelling of the face, lips, or tongue -fever, flu-like symptoms -genital sores -high blood pressure -no menstrual period for 6 weeks during use -pain, swelling, warmth in the leg -pelvic pain or tenderness -severe or sudden headache -signs of pregnancy -stomach cramping -sudden shortness of breath -trouble with balance, talking, or walking -unusual vaginal bleeding, discharge -yellowing of the eyes or skin Side effects that usually do not require medical attention (report to your doctor or health care professional if they continue or are bothersome): -acne -breast pain -change in sex drive or performance -changes in weight -cramping, dizziness, or faintness while the device is being inserted -headache -irregular menstrual bleeding within first 3 to 6 months of use -nausea This list may not describe all possible side effects. Call your doctor for medical advice about side effects. You may report  side effects to FDA at 1-800-FDA-1088. Where should I keep my medicine? This does not apply. NOTE: This sheet is a summary. It may not cover all possible information. If you have questions about this medicine, talk to your doctor, pharmacist, or health care provider.  2012, Elsevier/Gold Standard. (01/09/2008 6:39:08 PM)  Motrin 800mg  before sonohysterogram 66m before

## 2012-10-23 LAB — IPS PAP TEST WITH HPV

## 2012-10-28 ENCOUNTER — Ambulatory Visit (INDEPENDENT_AMBULATORY_CARE_PROVIDER_SITE_OTHER): Payer: BC Managed Care – PPO

## 2012-10-28 ENCOUNTER — Other Ambulatory Visit: Payer: Self-pay | Admitting: Gynecology

## 2012-10-28 ENCOUNTER — Ambulatory Visit (INDEPENDENT_AMBULATORY_CARE_PROVIDER_SITE_OTHER): Payer: BC Managed Care – PPO | Admitting: Gynecology

## 2012-10-28 VITALS — BP 120/82 | HR 78 | Resp 14 | Ht 62.5 in | Wt 134.0 lb

## 2012-10-28 DIAGNOSIS — Q51810 Arcuate uterus: Secondary | ICD-10-CM

## 2012-10-28 DIAGNOSIS — N920 Excessive and frequent menstruation with regular cycle: Secondary | ICD-10-CM

## 2012-10-28 DIAGNOSIS — N946 Dysmenorrhea, unspecified: Secondary | ICD-10-CM

## 2012-10-28 DIAGNOSIS — D259 Leiomyoma of uterus, unspecified: Secondary | ICD-10-CM

## 2012-10-28 DIAGNOSIS — Q513 Bicornate uterus: Secondary | ICD-10-CM

## 2012-10-28 MED ORDER — NORETHINDRONE 0.35 MG PO TABS: 1.0000 | ORAL_TABLET | Freq: Every day | ORAL | Status: DC

## 2012-10-28 NOTE — Progress Notes (Signed)
      Pt here with spouse to discuss u/s, known bicornuate uterus and associated menorrhagia.  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   sonohysterogram recommended to rule out other source of menorrhagia, consent obtained. Speculum placed, cervix visualized, cleansed with betadine, insemination catheter was advanced, the walls were gently distended, the left side filled immediately, the right delayed, no cavitary defect noted. Pt tolerated well. No biopsy needed Discussion regarding menorrhagia treatment options-cavity defect limits the ability to place either a mirena or skyla iud, we discussed progestin only options as pt gets headaches on estrogen, such as nexplanon or progestin only pilll.  We also discussed an endometrial ablation-based on the intracavitary findings, an HTA would be the best options to treat.  The procedure was outlined, risks of uterine perforation, vaginal scarring from the fluid, DVT and PE reviewed, anesthetic risks.  Also discussed anticipated results and longevity of treatment, we discussed potential for later hematometria and dysfunctional bleeding.  They are aware of potential for retreatment at later time if lining regrows.  Questions addressed. They are not interested hysterectomy at this time Pt will be travelling to [place] in December and is willing to use POP until OR- request they call for any issues with pill 63m spent discussing treatment options for menorrhagia

## 2012-10-28 NOTE — Patient Instructions (Signed)
Endometrial Ablation Endometrial ablation removes the lining of the uterus (endometrium). It is usually a same day, outpatient treatment. Ablation helps avoid major surgery (such as a hysterectomy). A hysterectomy is removal of the cervix and uterus. Endometrial ablation has less risk and complications, has a shorter recovery period and is less expensive. After endometrial ablation, most women will have little or no menstrual bleeding. You may not keep your fertility. Pregnancy is no longer likely after this procedure but if you are pre-menopausal, you still need to use a reliable method of birth control following the procedure because pregnancy can occur. REASONS TO HAVE THE PROCEDURE MAY INCLUDE:  Heavy periods.  Bleeding that is causing anemia.  Anovulatory bleeding, very irregular, bleeding.  Bleeding submucous fibroids (on the lining inside the uterus) if they are smaller than 3 centimeters. REASONS NOT TO HAVE THE PROCEDURE MAY INCLUDE:  You wish to have more children.  You have a pre-cancerous or cancerous problem. The cause of any abnormal bleeding must be diagnosed before having the procedure.  You have pain coming from the uterus.  You have a submucus fibroid larger than 3 centimeters.  You recently had a baby.  You recently had an infection in the uterus.  You have a severe retro-flexed, tipped uterus and cannot insert the instrument to do the ablation.  You had a Cesarean section or deep major surgery on the uterus.  The inner cavity of the uterus is too large for the endometrial ablation instrument. RISKS AND COMPLICATIONS   Perforation of the uterus.  Bleeding.  Infection of the uterus, bladder or vagina.  Injury to surrounding organs.  Cutting the cervix.  An air bubble to the lung (air embolus).  Pregnancy following the procedure.  Failure of the procedure to help the problem requiring hysterectomy.  Decreased ability to diagnose cancer in the lining of  the uterus. BEFORE THE PROCEDURE  The lining of the uterus must be tested to make sure there is no pre-cancerous or cancer cells present.  Medications may be given to make the lining of the uterus thinner.  Ultrasound may be used to evaluate the size and look for abnormalities of the uterus.  Future pregnancy is not desired. PROCEDURE  There are different ways to destroy the lining of the uterus.   Resectoscope - radio frequency-alternating electric current is the most common one used.  Cryotherapy - freezing the lining of the uterus.  Heated Free Liquid - heated salt (saline) solution inserted into the uterus.  Microwave - uses high energy microwaves in the uterus.  Thermal Balloon - a catheter with a balloon tip is inserted into the uterus and filled with heated fluid. Your caregiver will talk with you about the method used in this clinic. They will also instruct you on the pros and cons of the procedure. Endometrial ablation is performed along with a procedure called operative hysteroscopy. A narrow viewing tube is inserted through the birth canal (vagina) and through the cervix into the uterus. A tiny camera attached to the viewing tube (hysteroscope) allows the uterine cavity to be shown on a TV monitor during surgery. Your uterus is filled with a harmless liquid to make the procedure easier. The lining of the uterus is then removed. The lining can also be removed with a resectoscope which allows your surgeon to cut away the lining of the uterus under direct vision. Usually, you will be able to go home within an hour after the procedure. HOME CARE INSTRUCTIONS   Do   not drive for 24 hours.  No tampons, douching or intercourse for 2 weeks or until your caregiver approves.  Rest at home for 24 to 48 hours. You may then resume normal activities unless told differently by your caregiver.  Take your temperature two times a day for 4 days, and record it.  Take any medications your  caregiver has ordered, as directed.  Use some form of contraception if you are pre-menopausal and do not want to get pregnant. Bleeding after the procedure is normal. It varies from light spotting and mildly watery to bloody discharge for 4 to 6 weeks. You may also have mild cramping. Only take over-the-counter or prescription medicines for pain, discomfort, or fever as directed by your caregiver. Do not use aspirin, as this may aggravate bleeding. Frequent urination during the first 24 hours is normal. You will not know how effective your surgery is until at least 3 months after the surgery. SEEK IMMEDIATE MEDICAL CARE IF:   Bleeding is heavier than a normal menstrual cycle.  An oral temperature above 102 F (38.9 C) develops.  You have increasing cramps or pains not relieved with medication or develop belly (abdominal) pain which does not seem to be related to the same area of earlier cramping and pain.  You are light headed, weak or have fainting episodes.  You develop pain in the shoulder strap areas.  You have chest or leg pain.  You have abnormal vaginal discharge.  You have painful urination. Document Released: 10/28/2003 Document Revised: 03/12/2011 Document Reviewed: 01/25/2007 [employer] Patient Information 2014 [employer].  

## 2012-11-07 ENCOUNTER — Telehealth: Payer: Self-pay | Admitting: Gynecology

## 2012-11-07 NOTE — Telephone Encounter (Signed)
LMCTB to discuss insurance benefits for surgery

## 2012-11-20 NOTE — Telephone Encounter (Signed)
Spoke with patient about her benefits. She is going out of the country and will call with a decision when she comes back. She is trying birth control pills that Dr. Farrel Gobble prescribed her. They are working so far. She will call with an update when she returns.

## 2013-03-24 ENCOUNTER — Telehealth: Payer: Self-pay | Admitting: Radiology

## 2013-03-30 ENCOUNTER — Encounter: Payer: Self-pay | Admitting: Family Medicine

## 2013-03-30 DIAGNOSIS — N6320 Unspecified lump in the left breast, unspecified quadrant: Secondary | ICD-10-CM

## 2013-04-07 ENCOUNTER — Encounter: Payer: Self-pay | Admitting: Family Medicine

## 2013-04-22 IMAGING — CT CT ANKLE*L* W/O CM
2 of 4 series · 4 of 14 positions shown, 5 images · non-contrast
Comparison: Radiographs dated 08/10/2011

CLINICAL DATA: Persistent ankle pain.  Recent fibula fracture.

CT OF THE LEFT ANKLE WITHOUT CONTRAST
TECHNIQUE: Multidetector CT imaging was performed according to the
standard protocol. Multiplanar CT image reconstructions were also
generated.

[Series 2: left ankle bone · axial · 0.34mm/px · z∈[-198,-140]mm · 2 of 71 slices shown, 3 images]
[im 24/71  soft-tissue]
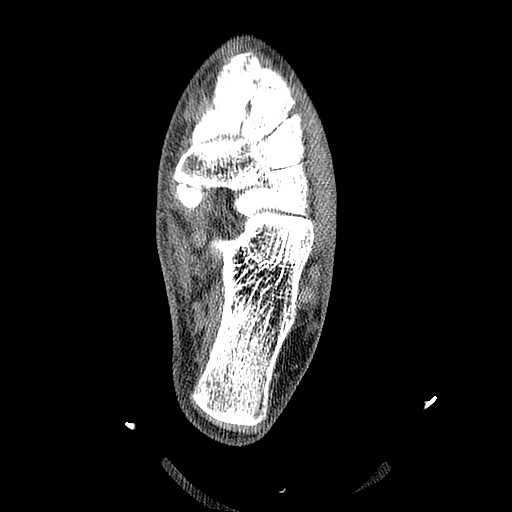
[im 24/71  bone]
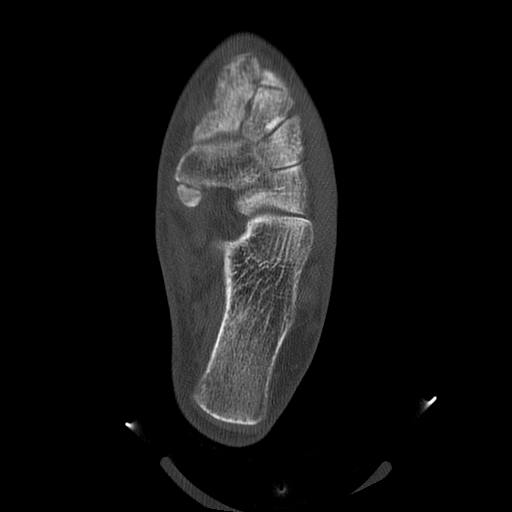
[im 47/71  bone]
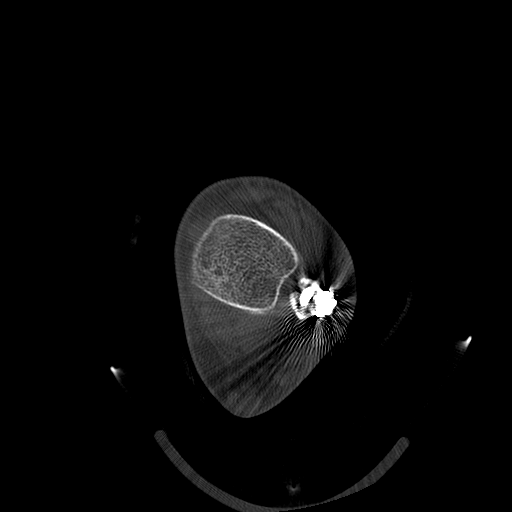

[Series 3: left ankle detail · axial · 0.34mm/px · z∈[-198,-140]mm · 2 of 71 slices shown]
[im 24/71  bone]
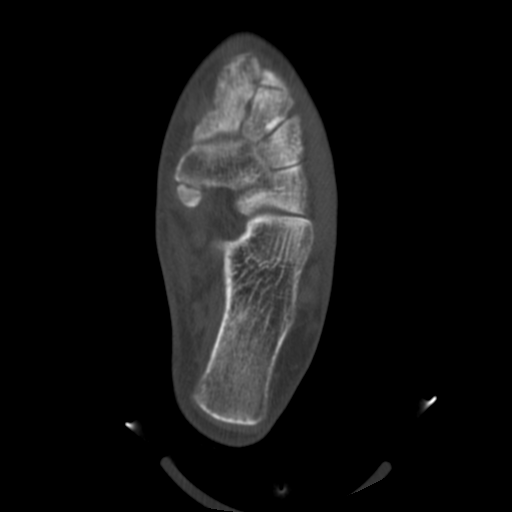
[im 47/71  bone]
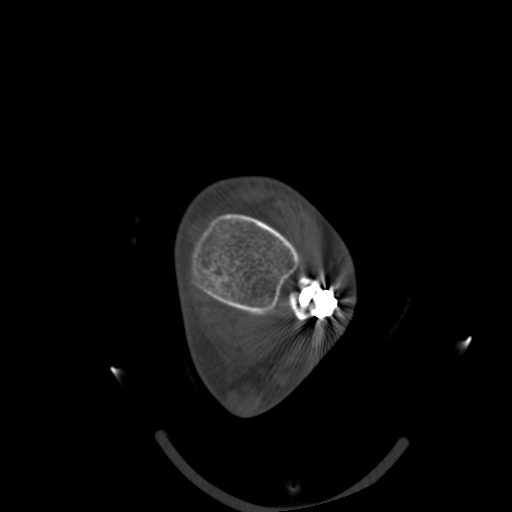

[4 of 14 positions shown; findings below may reference images not displayed]

FINDINGS: The distal fibular fracture has healed.  Side plate and
screws appear in good position. Small posterior malleolar fracture
has completely healed.  There are tiny calcifications between the
distal fibula and the tibia at the level of the ankle joint.

There is minimal posterior spurring of the distal tibia.  Joint
spaces preserved.  No significant joint effusion.

The other bones of the hind foot appear normal.  Anatomic variant
of an os naviculare.

The visible tendons around the ankle are intact and properly
located.
IMPRESSION: Healing of the distal fibular fracture.  Healing of the small
posterior malleolar fracture.

No significant abnormalities.

## 2013-04-24 ENCOUNTER — Encounter: Payer: Self-pay | Admitting: Family Medicine

## 2013-05-21 ENCOUNTER — Encounter: Payer: Self-pay | Admitting: Family Medicine

## 2013-11-02 ENCOUNTER — Encounter: Payer: Self-pay | Admitting: Gynecology

## 2014-03-01 ENCOUNTER — Encounter: Payer: Self-pay | Admitting: Family Medicine

## 2014-03-01 ENCOUNTER — Ambulatory Visit (INDEPENDENT_AMBULATORY_CARE_PROVIDER_SITE_OTHER): Payer: BLUE CROSS/BLUE SHIELD | Admitting: Family Medicine

## 2014-03-01 VITALS — BP 105/72 | HR 73 | Temp 98.3°F | Resp 16 | Ht 63.0 in | Wt 142.0 lb

## 2014-03-01 DIAGNOSIS — Z13220 Encounter for screening for lipoid disorders: Secondary | ICD-10-CM | POA: Diagnosis not present

## 2014-03-01 DIAGNOSIS — R635 Abnormal weight gain: Secondary | ICD-10-CM | POA: Diagnosis not present

## 2014-03-01 DIAGNOSIS — M25511 Pain in right shoulder: Secondary | ICD-10-CM | POA: Diagnosis not present

## 2014-03-01 DIAGNOSIS — N920 Excessive and frequent menstruation with regular cycle: Secondary | ICD-10-CM | POA: Diagnosis not present

## 2014-03-01 DIAGNOSIS — R591 Generalized enlarged lymph nodes: Secondary | ICD-10-CM | POA: Diagnosis not present

## 2014-03-01 DIAGNOSIS — Z0000 Encounter for general adult medical examination without abnormal findings: Secondary | ICD-10-CM

## 2014-03-01 LAB — CBC
HCT: 37.1 % (ref 36.0–46.0)
HEMOGLOBIN: 12.2 g/dL (ref 12.0–15.0)
MCH: 29.8 pg (ref 26.0–34.0)
MCHC: 32.9 g/dL (ref 30.0–36.0)
MCV: 90.7 fL (ref 78.0–100.0)
MPV: 11.6 fL (ref 8.6–12.4)
Platelets: 273 10*3/uL (ref 150–400)
RBC: 4.09 MIL/uL (ref 3.87–5.11)
RDW: 14.1 % (ref 11.5–15.5)
WBC: 5.7 10*3/uL (ref 4.0–10.5)

## 2014-03-01 MED ORDER — CYCLOBENZAPRINE HCL 10 MG PO TABS: ORAL_TABLET | ORAL | Status: DC

## 2014-03-01 MED ORDER — MELOXICAM 15 MG PO TABS: 15.0000 mg | ORAL_TABLET | Freq: Every day | ORAL | Status: DC

## 2014-03-01 NOTE — Progress Notes (Signed)
Subjective:    Patient ID: Melody Butler, female    DOB: 1970/01/31, 44 y.o.   MRN: [id number]  HPI Patient presents today for CPE.   Last PAP- 10/2012- normal Mammo- 3/15 Exercise- 5x/week- usually cardio and yoga Quit smoking Dec. 31, 2015.  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 She has two teenagers. Her family will be moving to [place], [place] this spring to be closer to family.   The patient has had off and on shoulder pain in her left shoulder for about 1 month. She can not recall a particular injury. She has tried ibuprofen 600 mg a couple of times a day without relief. Yoga exacerbates the pain which is dull and aching. No numbness or tingling, no weakness, full ROM.  She has noticed she is slowly gaining weight and it is more difficult to lose even when carefully watching her diet and exercise. Sleeps well. Some stress and anxiety, mostly related to her weight gain.   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 She had a biopsy of a right node in her 18s. She notices the swelling is worse around her periods which are very heavy. Her gyn has recommended uterine ablation and the patient is considering.   Review of Systems  Constitutional: Positive for unexpected weight change.  HENT: Negative.   Eyes: Negative.   Respiratory: Negative.   Cardiovascular: Negative.   Gastrointestinal: Positive for constipation (chronic, takes miralax daily with good results).  Endocrine: Negative.   Genitourinary: Negative.   Musculoskeletal: Positive for myalgias and neck pain.  Skin: Negative.   Allergic/Immunologic: Negative.   Hematological: Negative.   Psychiatric/Behavioral: Negative.       Objective:   Physical Exam  Constitutional: She is oriented to person, place, and time. She appears well-developed and well-nourished.  HENT:  Head: Normocephalic and atraumatic.  Right Ear: External ear normal.  Left Ear: External ear normal.  Nose: Nose  normal.  Mouth/Throat: Oropharynx is clear and moist.  Eyes: Conjunctivae are normal.  Neck: Normal range of motion. Neck supple. No JVD present. No thyromegaly present.  Cardiovascular: Normal rate, regular rhythm and normal heart sounds.   Pulmonary/Chest: Effort normal and breath sounds normal. Right breast exhibits no inverted nipple, no mass, no nipple discharge, no skin change and no tenderness. Left breast exhibits no inverted nipple, no mass, no nipple discharge, no skin change and no tenderness.  Abdominal: Soft. Bowel sounds are normal. She exhibits no distension and no mass. There is no tenderness. There is no rebound and no guarding.  Musculoskeletal: Normal range of motion.  Lymphadenopathy:    She has no cervical adenopathy.    She has axillary adenopathy.  Right axilla with some fullness and tenderness. Two discreet 1 cm nodes palpated.   Neurological: She is alert and oriented to person, place, and time.  Skin: Skin is warm and dry.  Psychiatric: She has a normal mood and affect. Her behavior is normal. Judgment and thought content normal.  Vitals reviewed. BP 105/72 mmHg  Pulse 73  Temp(Src) 98.3 F (36.8 C) (Oral)  Resp 16  Ht 5\' 3"  (1.6 m)  Wt 142 lb (64.411 kg)  BMI 25.16 kg/m2  SpO2 99%  LMP 02/08/2014     Assessment & Plan:  1. Annual physical exam -encouraged continued exercise and healthy eating choices.   2. Right shoulder pain - Flexeril/meloxicam - ROM  3. Menorrhagia with regular cycle - CBC  4. Weight gain -  Comprehensive metabolic panel - TSH  5. Screening for lipid disorders - Lipid panel  6. Lymphadenopathy - Ambulatory referral to [facility]anda, FNP-BC  [facility]  03/02/2014 6:24 AM

## 2014-03-02 ENCOUNTER — Encounter: Payer: Self-pay | Admitting: Family Medicine

## 2014-03-02 ENCOUNTER — Other Ambulatory Visit: Payer: Self-pay | Admitting: Family Medicine

## 2014-03-02 LAB — COMPREHENSIVE METABOLIC PANEL
ALK PHOS: 55 U/L (ref 39–117)
ALT: 10 U/L (ref 0–35)
AST: 14 U/L (ref 0–37)
Albumin: 3.6 g/dL (ref 3.5–5.2)
BILIRUBIN TOTAL: 0.4 mg/dL (ref 0.2–1.2)
BUN: 9 mg/dL (ref 6–23)
CO2: 20 meq/L (ref 19–32)
CREATININE: 0.64 mg/dL (ref 0.50–1.10)
Calcium: 8.4 mg/dL (ref 8.4–10.5)
Chloride: 108 mEq/L (ref 96–112)
GLUCOSE: 96 mg/dL (ref 70–99)
Potassium: 4.1 mEq/L (ref 3.5–5.3)
SODIUM: 137 meq/L (ref 135–145)
TOTAL PROTEIN: 6.1 g/dL (ref 6.0–8.3)

## 2014-03-02 LAB — LIPID PANEL
CHOL/HDL RATIO: 3 ratio
Cholesterol: 152 mg/dL (ref 0–200)
HDL: 51 mg/dL (ref >=46)
LDL Cholesterol: 81 mg/dL (ref 0–99)
TRIGLYCERIDES: 99 mg/dL (ref <150)
VLDL: 20 mg/dL (ref 0–40)

## 2014-03-02 LAB — TSH: TSH: 9.96 u[IU]/mL — AB (ref 0.350–4.500)

## 2014-03-02 MED ORDER — LEVOTHYROXINE SODIUM 50 MCG PO TABS: 50.0000 ug | ORAL_TABLET | Freq: Every day | ORAL | Status: DC

## 2014-04-05 ENCOUNTER — Other Ambulatory Visit: Payer: Self-pay | Admitting: Family Medicine

## 2014-04-06 NOTE — Telephone Encounter (Signed)
Debbie, do you want to give RFs?

## 2014-05-17 ENCOUNTER — Ambulatory Visit (INDEPENDENT_AMBULATORY_CARE_PROVIDER_SITE_OTHER): Payer: BLUE CROSS/BLUE SHIELD | Admitting: Family Medicine

## 2014-05-17 ENCOUNTER — Encounter: Payer: Self-pay | Admitting: Family Medicine

## 2014-05-17 VITALS — HR 83 | Temp 98.1°F | Resp 16 | Ht 62.75 in | Wt 133.6 lb

## 2014-05-17 DIAGNOSIS — E034 Atrophy of thyroid (acquired): Secondary | ICD-10-CM | POA: Diagnosis not present

## 2014-05-17 DIAGNOSIS — E039 Hypothyroidism, unspecified: Secondary | ICD-10-CM

## 2014-05-17 DIAGNOSIS — E038 Other specified hypothyroidism: Secondary | ICD-10-CM

## 2014-05-17 HISTORY — DX: Hypothyroidism, unspecified: E03.9

## 2014-05-17 NOTE — Progress Notes (Signed)
   Subjective:    Patient ID: Melody Butler, female    DOB: [birth year], 44 y.o.   MRN: [id number]  HPI Patient presents today for follow up of hypothyroidism. She was started on synthroid 50 mcg 3/15. She has been compliant with therapy. She has been watching her diet and exercising and has lost 12 pounds. Energy level improved. No palpitations, no insomnia.   Review of Systems     Objective:   Physical Exam Physical Exam  Vitals reviewed. Constitutional: She is oriented to person, place, and time. She appears well-developed and well-nourished.  HENT:  Head: Normocephalic and atraumatic.  Eyes: Conjunctivae are normal.  Neck: Normal range of motion. Neck supple.  Cardiovascular: Normal rate.   Pulmonary/Chest: Effort normal.  Musculoskeletal: Normal range of motion.  Neurological: She is alert and oriented to person, place, and time.  Skin: Skin is warm and dry.  Psychiatric: She has a normal mood and affect. Her behavior is normal. Judgment and thought content normal.    Pulse 83  Temp(Src) 98.1 F (36.7 C) (Oral)  Resp 16  Ht 5' 2.75" (1.594 m)  Wt 133 lb 9.6 oz (60.601 kg)  BMI 23.85 kg/m2  SpO2 100%  LMP 04/26/2014     Assessment & Plan:  1. Hypothyroidism due to acquired atrophy of thyroid - TSH- will refill synthroid after lab results are back.  - discussed diagnosis and need for life long supplementation - will mail her copies of all recent labs since she is moving in 2 weeks to [place].  Clarene Reamer, FNP-BC  [facility]  05/17/2014 12:13 PM

## 2014-05-18 ENCOUNTER — Other Ambulatory Visit: Payer: Self-pay | Admitting: Family Medicine

## 2014-05-18 ENCOUNTER — Encounter: Payer: Self-pay | Admitting: Family Medicine

## 2014-05-18 DIAGNOSIS — E034 Atrophy of thyroid (acquired): Secondary | ICD-10-CM

## 2014-05-18 LAB — TSH: TSH: 1.962 u[IU]/mL (ref 0.350–4.500)

## 2014-05-18 MED ORDER — LEVOTHYROXINE SODIUM 50 MCG PO TABS: 50.0000 ug | ORAL_TABLET | Freq: Every day | ORAL | Status: AC

## 2014-05-19 ENCOUNTER — Telehealth: Payer: Self-pay

## 2014-05-19 NOTE — Telephone Encounter (Signed)
Patient is calling to get lab results. Please call! [phone]

## 2017-04-04 ENCOUNTER — Encounter: Payer: Self-pay | Admitting: Physician Assistant
